# Patient Record
Sex: Female | Born: 1970 | Race: Black or African American | Hispanic: No | Marital: Married | Smoking: Current every day smoker
Health system: Southern US, Community
[De-identification: ages and names within clinical notes are randomized; demographics above are authoritative.]

## PROBLEM LIST (undated history)

## (undated) DIAGNOSIS — A6009 Herpesviral infection of other urogenital tract: Secondary | ICD-10-CM

## (undated) HISTORY — PX: TUBAL LIGATION: SHX77

---

## 2002-11-17 ENCOUNTER — Emergency Department (HOSPITAL_COMMUNITY): Admission: EM | Admit: 2002-11-17 | Discharge: 2002-11-17 | Payer: Self-pay

## 2003-03-04 ENCOUNTER — Emergency Department (HOSPITAL_COMMUNITY): Admission: EM | Admit: 2003-03-04 | Discharge: 2003-03-04 | Payer: Self-pay | Admitting: Emergency Medicine

## 2004-05-15 ENCOUNTER — Emergency Department (HOSPITAL_COMMUNITY): Admission: EM | Admit: 2004-05-15 | Discharge: 2004-05-16 | Payer: Self-pay | Admitting: Emergency Medicine

## 2004-07-10 ENCOUNTER — Other Ambulatory Visit: Admission: RE | Admit: 2004-07-10 | Discharge: 2004-07-10 | Payer: Self-pay | Admitting: Family Medicine

## 2006-02-09 IMAGING — US US TRANSVAGINAL NON-OB
1 series · 14 of 25 positions shown · non-contrast
Comparison: report from prior exam from 11/17/02.

CLINICAL DATA: Abdominal pain, nausea, vomiting
 TRANSABDOMINAL AND TRANSVAGINAL PELVIC ULTRASOUND

[Series 1: unknown · 0.30mm/px · 14 of 43 slices shown]
[im 1/43]
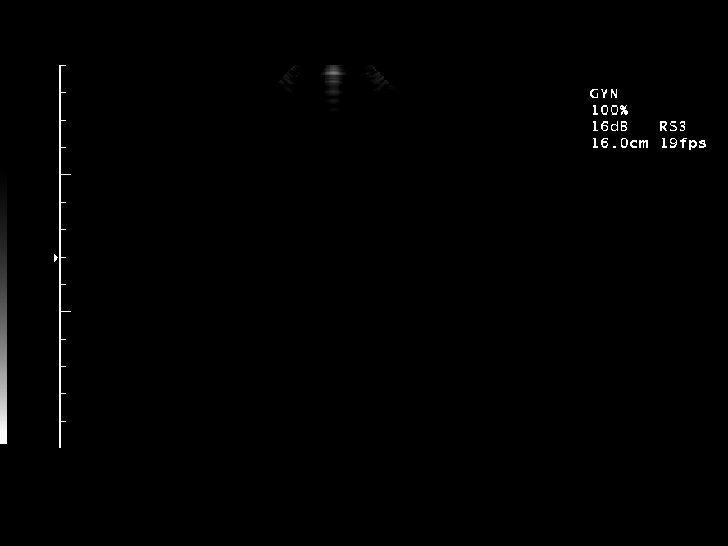
[im 4/43]
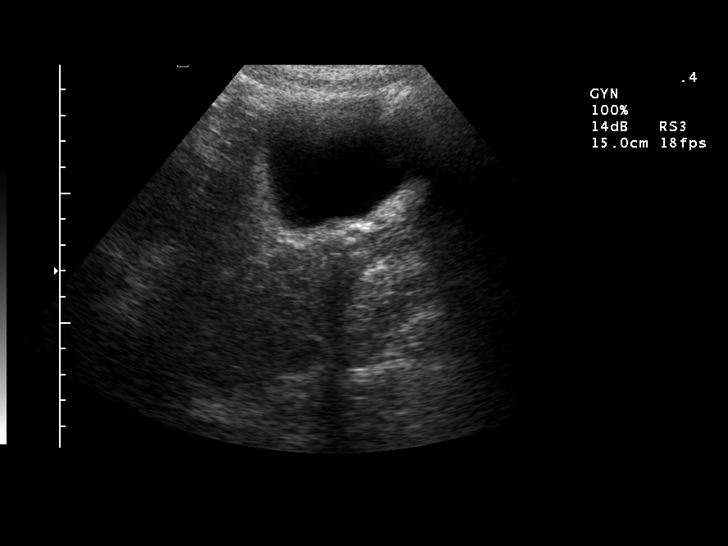
[im 8/43]
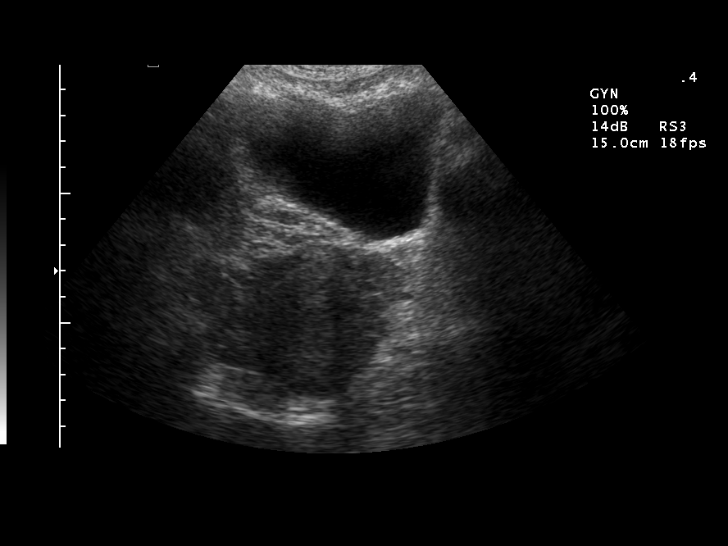
[im 11/43]
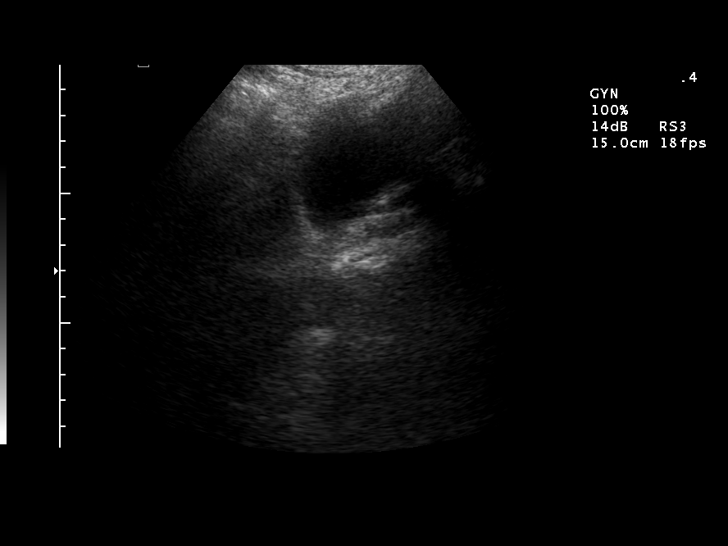
[im 15/43]
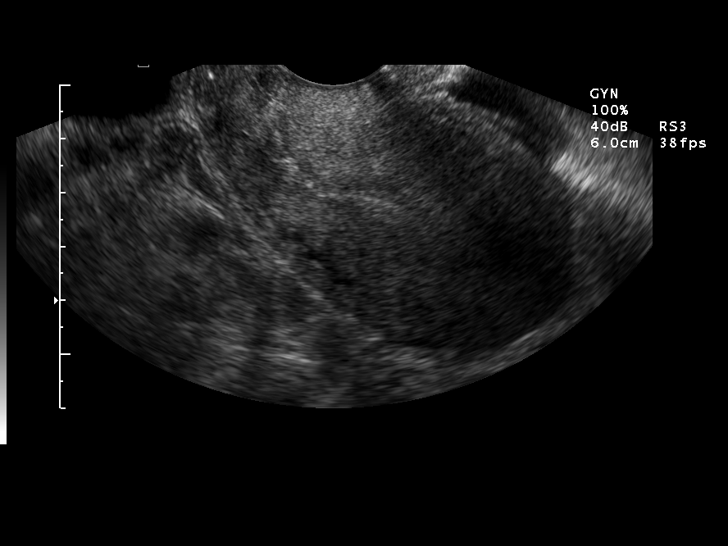
[im 16/43]
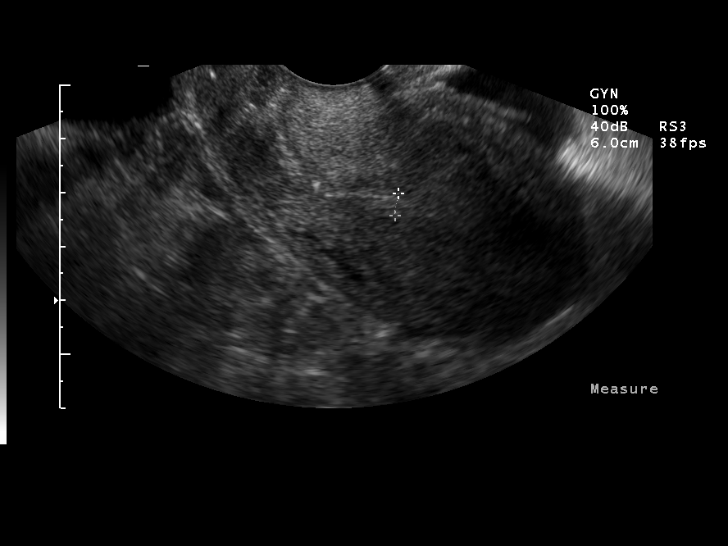
[im 20/43]
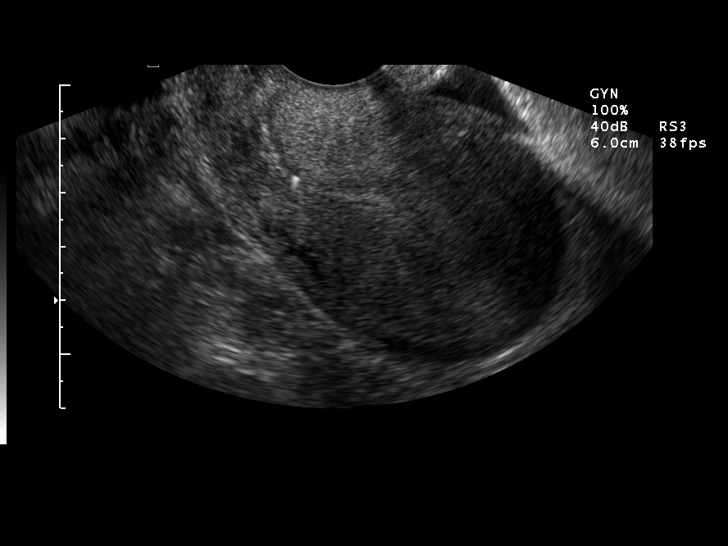
[im 23/43]
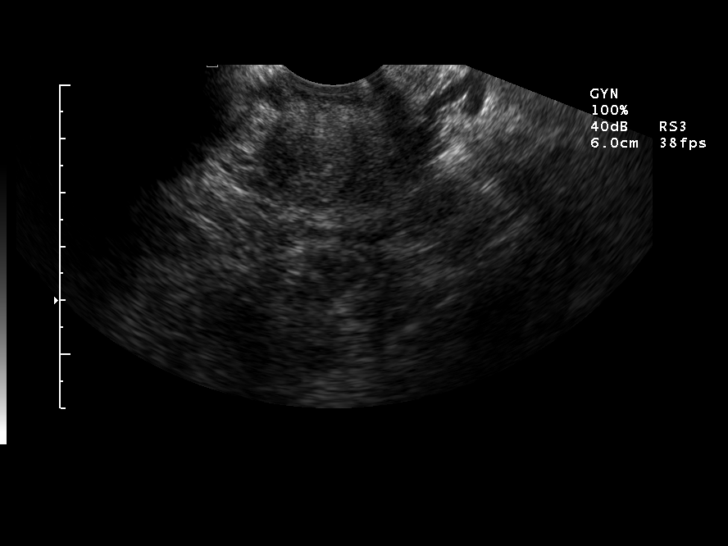
[im 27/43]
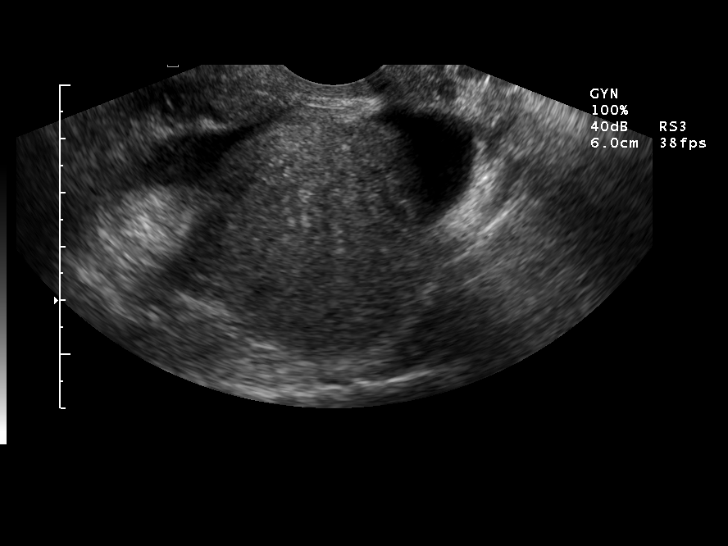
[im 29/43]
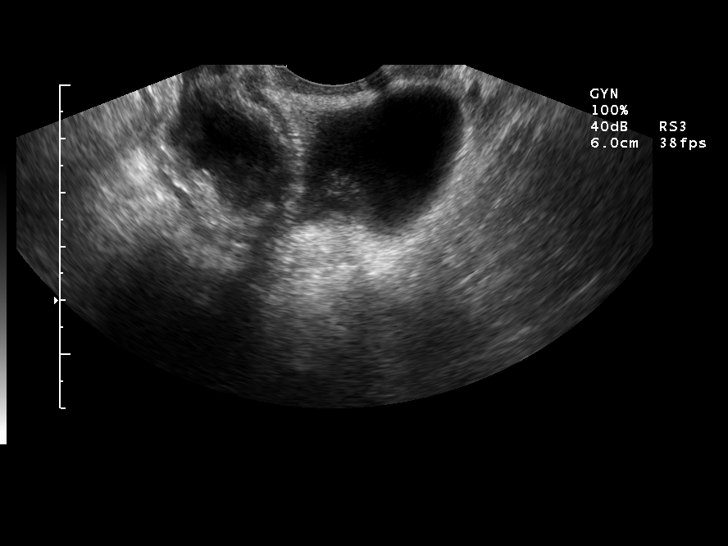
[im 32/43]
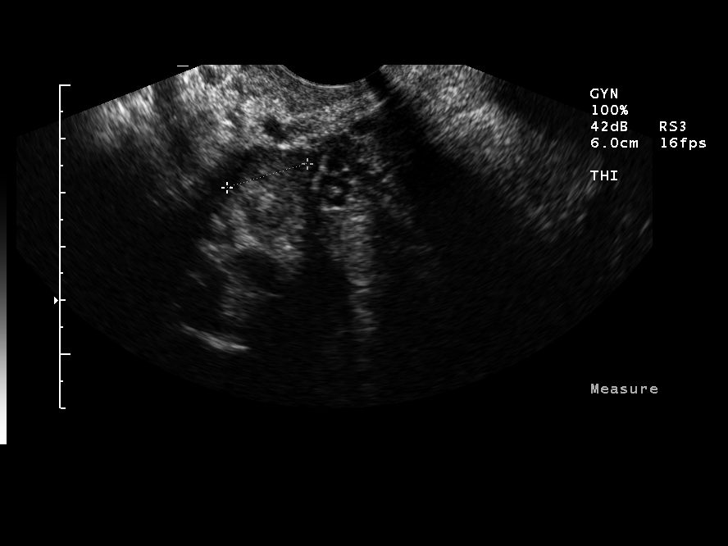
[im 36/43]
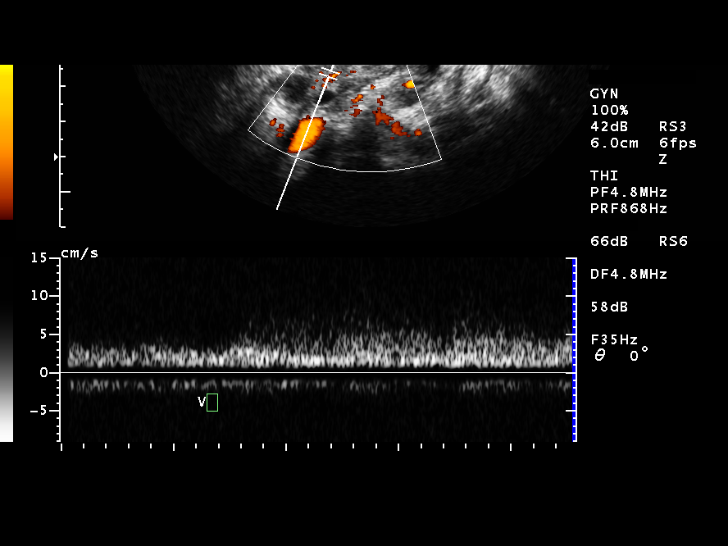
[im 39/43]
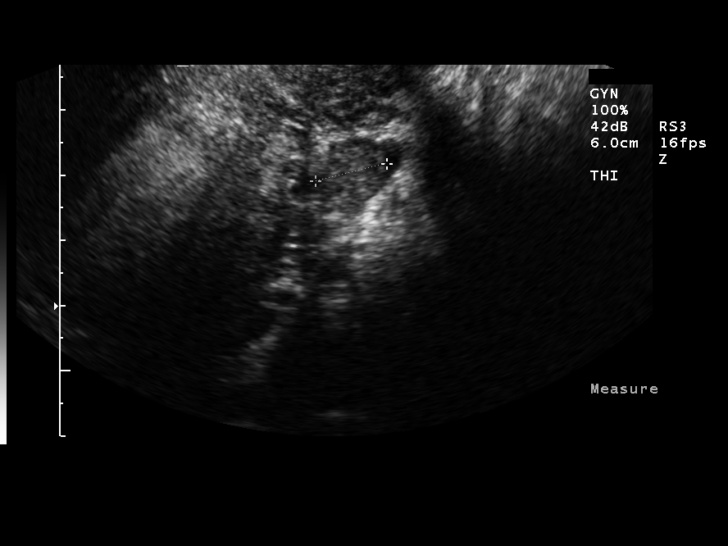
[im 43/43]
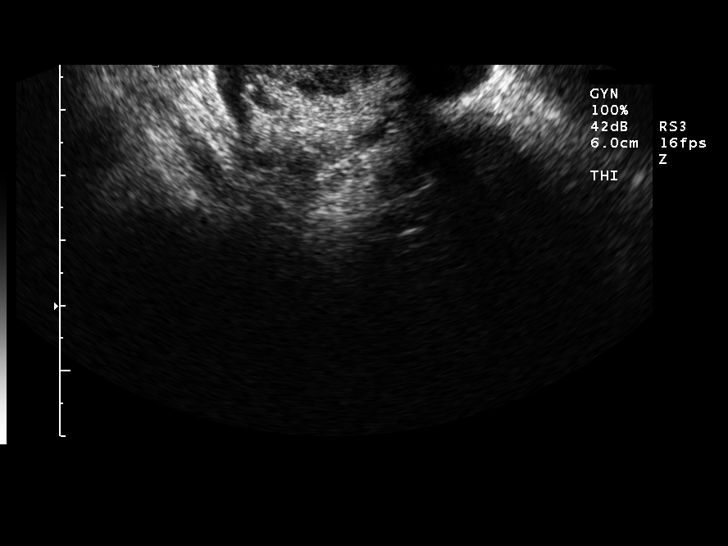

[14 of 25 positions shown; findings below may reference images not displayed]

FINDINGS: There is noted to be some cervical tenderness on the transvaginal portion of the exam.
 The uterus is retroverted.  Endometrial stripe measures 4 mm in thickness. There is moderate free pelvic fluid.  Myometrium appears unremarkable.
 Right ovary measures 2.9 x 1.6 x 1.6 cm and appears within normal limits.  Arterial and venous flow is demonstrated in the right ovary.
 Left ovary measures 2.6 x 1.5 x 1.4 cm and likewise appears normal with arterial and venous flow demonstrated on doppler evaluation.
 IMPRESSION
 Moderate free pelvic fluid. The cause of this free fluid is not readily apparent.
 There was some cervical tenderness.  Correlate with physical exam and assessment for PID.  We do not demonstrate evidence of a tuboovarian abscess.  Also recommend urine pregnancy test to exclude the possibility of occult ectopic pregnancy.

## 2006-04-16 ENCOUNTER — Emergency Department (HOSPITAL_COMMUNITY): Admission: EM | Admit: 2006-04-16 | Discharge: 2006-04-16 | Payer: Self-pay | Admitting: Emergency Medicine

## 2006-04-18 ENCOUNTER — Emergency Department (HOSPITAL_COMMUNITY): Admission: EM | Admit: 2006-04-18 | Discharge: 2006-04-18 | Payer: Self-pay | Admitting: Emergency Medicine

## 2006-04-19 ENCOUNTER — Emergency Department (HOSPITAL_COMMUNITY): Admission: AD | Admit: 2006-04-19 | Discharge: 2006-04-19 | Payer: Self-pay | Admitting: Emergency Medicine

## 2013-05-24 ENCOUNTER — Encounter (HOSPITAL_COMMUNITY): Payer: Self-pay | Admitting: *Deleted

## 2013-05-24 ENCOUNTER — Emergency Department (HOSPITAL_COMMUNITY)
Admission: EM | Admit: 2013-05-24 | Discharge: 2013-05-24 | Disposition: A | Discharge status: Home / Self Care | Payer: No Typology Code available for payment source | Attending: Emergency Medicine | Admitting: Emergency Medicine

## 2013-05-24 DIAGNOSIS — A6009 Herpesviral infection of other urogenital tract: Secondary | ICD-10-CM

## 2013-05-24 DIAGNOSIS — F17200 Nicotine dependence, unspecified, uncomplicated: Secondary | ICD-10-CM | POA: Insufficient documentation

## 2013-05-24 DIAGNOSIS — B009 Herpesviral infection, unspecified: Secondary | ICD-10-CM | POA: Insufficient documentation

## 2013-05-24 HISTORY — DX: Herpesviral infection of other urogenital tract: A60.09

## 2013-05-24 MED ORDER — VALACYCLOVIR HCL 500 MG PO TABS: 1000.0000 mg | ORAL_TABLET | Freq: Two times a day (BID) | ORAL | Status: AC

## 2013-05-24 NOTE — ED Provider Notes (Signed)
Medical screening examination/treatment/procedure(s) were performed by non-physician practitioner and as supervising physician I was immediately available for consultation/collaboration.   Shelda Jakes, MD 05/24/13 1120

## 2013-05-24 NOTE — ED Provider Notes (Signed)
CSN: [id number]     Arrival date & time 05/24/13  0932 History   First MD Initiated Contact with Patient 05/24/13 1021     Chief Complaint  Patient presents with  . Medication Refill   (Consider location/radiation/quality/duration/timing/severity/associated sxs/prior Treatment) HPI Comments: Patient states she has previously been a patient of Dr. bland in [facility]. She has not been to the doctor in some time. She has a history of genital herpes. She is now out of medication and requesting a refill on this particular medication. The patient is moving from the [place] area to the [facility] area and has not secured a primary physician yet. The patient denies any high fever. She states that she has a rash in the genital area that is typical of her usual outbreak. He's not had nausea vomiting or diarrhea. There've been no other problems or symptoms reported.  The history is provided by the patient.    Past Medical History  Diagnosis Date  . Herpes genitalis in women    Past Surgical History  Procedure Laterality Date  . Tubal ligation     No family history on file. History  Substance Use Topics  . Smoking status: Current Every Day Smoker    Types: Cigarettes  . Smokeless tobacco: Not on file  . Alcohol Use: Yes     Comment: daily beer   OB History   Grav Para Term Preterm Abortions TAB SAB Ect Mult Living                 Review of Systems  Constitutional: Negative for activity change.       All ROS Neg except as noted in HPI  HENT: Negative for nosebleeds and neck pain.   Eyes: Negative for photophobia and discharge.  Respiratory: Negative for cough, shortness of breath and wheezing.   Cardiovascular: Negative for chest pain and palpitations.  Gastrointestinal: Negative for abdominal pain and blood in stool.  Genitourinary: Positive for vaginal pain. Negative for dysuria, frequency and hematuria.  Musculoskeletal: Negative for back pain and  arthralgias.  Skin: Positive for rash.  Neurological: Negative for dizziness, seizures and speech difficulty.  Psychiatric/Behavioral: Negative for hallucinations and confusion.    Allergies  Review of patient's allergies indicates no known allergies.  Home Medications  No current outpatient prescriptions on file. BP 109/87  Pulse 79  Temp(Src) 99 F (37.2 C) (Oral)  Resp 16  Ht 5\' 6"  (1.676 m)  Wt 98 lb (44.453 kg)  BMI 15.83 kg/m2  SpO2 98%  LMP 05/24/2013 Physical Exam  Nursing note and vitals reviewed. Constitutional: She is oriented to person, place, and time. She appears well-developed and well-nourished.  Non-toxic appearance.  HENT:  Head: Normocephalic.  Right Ear: Tympanic membrane and external ear normal.  Left Ear: Tympanic membrane and external ear normal.  Eyes: EOM and lids are normal. Pupils are equal, round, and reactive to light.  Neck: Normal range of motion. Neck supple. Carotid bruit is not present.  Cardiovascular: Normal rate, regular rhythm, normal heart sounds, intact distal pulses and normal pulses.   Pulmonary/Chest: Breath sounds normal. No respiratory distress.  Abdominal: Soft. Bowel sounds are normal. There is no tenderness. There is no guarding.  Musculoskeletal: Normal range of motion.  Lymphadenopathy:       Head (right side): No submandibular adenopathy present.       Head (left side): No submandibular adenopathy present.    She has no cervical adenopathy.  Neurological: She is alert and  oriented to person, place, and time. She has normal strength. No cranial nerve deficit or sensory deficit.  Skin: Skin is warm and dry.  Psychiatric: She has a normal mood and affect. Her speech is normal.    ED Course  Procedures (including critical care time) Labs Review Labs Reviewed - No data to display Imaging Review No results found.  MDM  No diagnosis found. *I have reviewed nursing notes, vital signs, and all appropriate lab and imaging  results for this patient.**  Patient has history of genital herpes. She is now experiencing an outbreak over last 2 days. The patient is usually treated with Valtrex. She is moving from [facility] to the [facility] area. She request assistance with her Valtrex until she can establish a primary physician.  Valtrex 500 mg twice a day for 3 days given to the patient.  Kathie Dike, PA-C 05/24/13 1110

## 2013-05-24 NOTE — ED Notes (Signed)
Pt states she has herpes and does not have a PMD. States she has developed an outbreak x 2 days and needs a Rx of valtrex.

## 2013-05-24 NOTE — Progress Notes (Signed)
ED/CM noted patient did not have health insurance and/or PCP listed in the computer.  Patient was given the [place] resource handout with information on the clinics, food pantries, and the handout for new health insurance sign-up.  Patient expressed appreciation for this. 

## 2014-08-12 ENCOUNTER — Ambulatory Visit (INDEPENDENT_AMBULATORY_CARE_PROVIDER_SITE_OTHER): Payer: 59 | Admitting: Obstetrics and Gynecology

## 2014-08-12 ENCOUNTER — Encounter: Payer: Self-pay | Admitting: Obstetrics and Gynecology

## 2014-08-12 VITALS — BP 110/74 | Ht 66.0 in | Wt 113.0 lb

## 2014-08-12 DIAGNOSIS — A6009 Herpesviral infection of other urogenital tract: Secondary | ICD-10-CM

## 2014-08-12 DIAGNOSIS — A609 Anogenital herpesviral infection, unspecified: Secondary | ICD-10-CM

## 2014-08-12 MED ORDER — ACYCLOVIR 400 MG PO TABS: 400.0000 mg | ORAL_TABLET | Freq: Two times a day (BID) | ORAL | Status: DC

## 2014-08-12 NOTE — Progress Notes (Signed)
Patient ID: Barbara Walker, female   DOB: 07/27/1971, 43 y.o.   MRN: [id number] Pt here today for refill on medication. Pt needs a refill on her valtrex. Pt states that she is having tingling down there, the felling she gets before she has an outbreak.

## 2014-08-12 NOTE — Progress Notes (Signed)
   [facility] Visit  Patient name: Barbara Walker Santino MRN [id number]  Date of birth: 01/30/1971  CC & HPI:  Barbara Walker [place] is a 43 y.o. female presenting today for refill on her medication. The medication that she needs a refill on is Valtrex. She knows that she needs a refill because there is tingling in her vaginal region and this is the feeling that she has when she is about to have a outbreak. She treats herself as a suppression. She reports that she was taking acylcovir before she lost her insurance. She has just gotten her insurance back.   ROS:  No other complaints single no sexual partner.  Pertinent History Reviewed:   Reviewed: Significant for  Medical         Past Medical History  Diagnosis Date  . Herpes genitalis in women                               Surgical Hx:    Past Surgical History  Procedure Laterality Date  . Tubal ligation     Medications: Reviewed & Updated - see associated section                      No current outpatient prescriptions on file.   Social History: Reviewed -  reports that she has been smoking Cigarettes.  She has a 20 pack-year smoking history. She has never used smokeless tobacco.  Objective Findings:  Vitals: Blood pressure 110/74, height 5\' 6"  (1.676 m), weight 113 lb (51.256 kg).  Physical Examination: General appearance - alert, well appearing, and in no distress and oriented to person, place, and time Mental status - alert, oriented to person, place, and time, normal mood, behavior, speech, dress, motor activity, and thought processes Pelvic deferred.  Assessment & Plan:   A:  1. 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 Acyclovir Rx q 90 days with three refills.     This chart was scribed for Tilda BurrowJohn Saladin Petrelli V, MD by Chestine SporeSoijett Blue, ED Scribe. The patient was seen in room 2 at 12:45 PM.

## 2015-10-13 ENCOUNTER — Other Ambulatory Visit: Payer: Self-pay | Admitting: Obstetrics and Gynecology

## 2015-10-26 ENCOUNTER — Other Ambulatory Visit: Payer: Self-pay | Admitting: Obstetrics and Gynecology

## 2015-10-26 NOTE — Telephone Encounter (Signed)
refil acylovir

## 2017-01-16 ENCOUNTER — Other Ambulatory Visit: Payer: Self-pay | Admitting: Obstetrics and Gynecology

## 2017-09-17 ENCOUNTER — Other Ambulatory Visit: Payer: Self-pay | Admitting: Obstetrics and Gynecology

## 2017-09-22 ENCOUNTER — Other Ambulatory Visit: Payer: Self-pay | Admitting: Obstetrics and Gynecology

## 2017-10-02 ENCOUNTER — Encounter: Payer: Self-pay | Admitting: Obstetrics and Gynecology

## 2017-10-02 ENCOUNTER — Ambulatory Visit (INDEPENDENT_AMBULATORY_CARE_PROVIDER_SITE_OTHER): Payer: 59 | Admitting: Obstetrics and Gynecology

## 2017-10-02 VITALS — BP 138/86 | HR 117 | Ht 66.0 in | Wt 110.4 lb

## 2017-10-02 DIAGNOSIS — Z8619 Personal history of other infectious and parasitic diseases: Secondary | ICD-10-CM | POA: Diagnosis not present

## 2017-10-02 DIAGNOSIS — R61 Generalized hyperhidrosis: Secondary | ICD-10-CM | POA: Diagnosis not present

## 2017-10-02 NOTE — Progress Notes (Signed)
   [facility] Visit  10/02/2017            Patient name: Barbara Walker MRN [id number]  Date of birth: 12/21/1970  CC & HPI:  Barbara Beckerntoneia L Stecher is a 47 y.o. female presenting today for medication refill of acyclovir. Her periods stopped at 47 years old. She had a tubal ligation. She has been experiencing night sweats and hot sweats that keep her up at night. She has recently gotten insurance, so did not get her pap smears. She takes acyclovir twice a day for HSV, and denies any breakthroughs. Patient has not been sexually active for a year. She has not been seen in about 3 years. She is interested in getting a full physical exam. Her job requires her to be very active, with moving and lifting heavy weighted items.  ROS:  ROS  (-) fever (+) hot sweats   Pertinent History Reviewed:   Reviewed: Significant for HSV, tubal ligation Medical         Past Medical History:  Diagnosis Date  . Herpes genitalis in women                               Surgical Hx:    Past Surgical History:  Procedure Laterality Date  . TUBAL LIGATION     Medications: Reviewed & Updated - see associated section                       Current Outpatient Medications:  .  acyclovir (ZOVIRAX) 400 MG tablet, TAKE 1 TABLET BY MOUTH TWICE DAILY FOR  SUPRESSION, Disp: 60 tablet, Rfl: 3   Social History: Reviewed -  reports that she has been smoking cigarettes.  She has a 20.00 pack-year smoking history. she has never used smokeless tobacco.  Objective Findings:  Vitals: Blood pressure 138/86, pulse (!) 117, height 5\' 6"  (1.676 m), weight 110 lb 6.4 oz (50.1 kg), last menstrual period 05/24/2013.  PHYSICAL EXAMINATION General appearance - alert, well appearing, and in no distress, oriented to person, place, and time and normal appearing weight Mental status - alert, oriented to person, place, and time, normal mood, behavior, speech, dress, motor activity, and thought processes Chest - not examined Heart -  normal rate, regular rhythm, normal S1, S2, no murmurs, rubs, clicks or gallops, not examined Abdomen - not examined Breasts - breasts appear normal, no suspicious masses, no skin or nipple changes or axillary nodes, not examined Skin -   PELVIC Not indicated   Assessment & Plan:   A:  1.  hx of HSV2, on suppression, renew pill acyclovir 400 bid supression 2.    Needs well woman visit, no paps in 10 years  P:  1. Acyclovir refilled 2. F/u in 1 month for well woman exam   By signing my name below, I, Izna Ahmed, attest that this documentation has been prepared under the direction and in the presence of Tilda BurrowFerguson, Shamicka Inga V, MD. Electronically Signed: Redge GainerIzna Ahmed, Medical Scribe. 10/02/17. 11:31 AM.  I personally performed the services described in this documentation, which was SCRIBED in my presence. The recorded information has been reviewed and considered accurate. It has been edited as necessary during review. Tilda BurrowJohn V Darelle Kings, MD

## 2017-10-30 ENCOUNTER — Other Ambulatory Visit: Payer: 59 | Admitting: Obstetrics and Gynecology

## 2018-05-28 ENCOUNTER — Other Ambulatory Visit: Payer: Self-pay | Admitting: Obstetrics and Gynecology

## 2021-04-25 ENCOUNTER — Emergency Department (HOSPITAL_BASED_OUTPATIENT_CLINIC_OR_DEPARTMENT_OTHER)
Admission: EM | Admit: 2021-04-25 | Discharge: 2021-04-25 | Disposition: A | Discharge status: Home / Self Care | Payer: 59 | Source: Home / Self Care | Attending: Emergency Medicine | Admitting: Emergency Medicine

## 2021-04-25 ENCOUNTER — Emergency Department (HOSPITAL_BASED_OUTPATIENT_CLINIC_OR_DEPARTMENT_OTHER)
Admission: EM | Admit: 2021-04-25 | Discharge: 2021-04-25 | Disposition: A | Discharge status: Home / Self Care | Payer: 59 | Attending: Emergency Medicine | Admitting: Emergency Medicine

## 2021-04-25 ENCOUNTER — Other Ambulatory Visit: Payer: Self-pay

## 2021-04-25 ENCOUNTER — Emergency Department (HOSPITAL_BASED_OUTPATIENT_CLINIC_OR_DEPARTMENT_OTHER): Payer: 59

## 2021-04-25 ENCOUNTER — Encounter (HOSPITAL_BASED_OUTPATIENT_CLINIC_OR_DEPARTMENT_OTHER): Payer: Self-pay | Admitting: *Deleted

## 2021-04-25 DIAGNOSIS — R1013 Epigastric pain: Secondary | ICD-10-CM | POA: Insufficient documentation

## 2021-04-25 DIAGNOSIS — R1084 Generalized abdominal pain: Secondary | ICD-10-CM | POA: Insufficient documentation

## 2021-04-25 DIAGNOSIS — R197 Diarrhea, unspecified: Secondary | ICD-10-CM | POA: Insufficient documentation

## 2021-04-25 DIAGNOSIS — R112 Nausea with vomiting, unspecified: Secondary | ICD-10-CM | POA: Insufficient documentation

## 2021-04-25 DIAGNOSIS — F17210 Nicotine dependence, cigarettes, uncomplicated: Secondary | ICD-10-CM | POA: Diagnosis not present

## 2021-04-25 DIAGNOSIS — Z20822 Contact with and (suspected) exposure to covid-19: Secondary | ICD-10-CM | POA: Insufficient documentation

## 2021-04-25 DIAGNOSIS — R509 Fever, unspecified: Secondary | ICD-10-CM

## 2021-04-25 LAB — COMPREHENSIVE METABOLIC PANEL
ALT: 21 U/L (ref 0–44)
AST: 33 U/L (ref 15–41)
Albumin: 4.6 g/dL (ref 3.5–5.0)
Alkaline Phosphatase: 92 U/L (ref 38–126)
Anion gap: 15 (ref 5–15)
BUN: 16 mg/dL (ref 6–20)
CO2: 20 mmol/L — ABNORMAL LOW (ref 22–32)
Calcium: 9.8 mg/dL (ref 8.9–10.3)
Chloride: 104 mmol/L (ref 98–111)
Creatinine, Ser: 0.9 mg/dL (ref 0.44–1.00)
GFR, Estimated: >60 mL/min (ref >60)
Glucose, Bld: 119 mg/dL — ABNORMAL HIGH (ref 70–99)
Potassium: 3.2 mmol/L — ABNORMAL LOW (ref 3.5–5.1)
Sodium: 139 mmol/L (ref 135–145)
Total Bilirubin: 0.9 mg/dL (ref 0.3–1.2)
Total Protein: 7.9 g/dL (ref 6.5–8.1)

## 2021-04-25 LAB — CBC WITH DIFFERENTIAL/PLATELET
Abs Immature Granulocytes: 0.03 10*3/uL (ref 0.00–0.07)
Basophils Absolute: 0.1 10*3/uL (ref 0.0–0.1)
Basophils Relative: 1 %
Eosinophils Absolute: 0.1 10*3/uL (ref 0.0–0.5)
Eosinophils Relative: 1 %
HCT: 46.2 % — ABNORMAL HIGH (ref 36.0–46.0)
Hemoglobin: 16.9 g/dL — ABNORMAL HIGH (ref 12.0–15.0)
Immature Granulocytes: 0 %
Lymphocytes Relative: 15 %
Lymphs Abs: 1.4 10*3/uL (ref 0.7–4.0)
MCH: 33.3 pg (ref 26.0–34.0)
MCHC: 36.6 g/dL — ABNORMAL HIGH (ref 30.0–36.0)
MCV: 91.1 fL (ref 80.0–100.0)
Monocytes Absolute: 0.5 10*3/uL (ref 0.1–1.0)
Monocytes Relative: 6 %
Neutro Abs: 7.2 10*3/uL (ref 1.7–7.7)
Neutrophils Relative %: 77 %
Platelets: 209 10*3/uL (ref 150–400)
RBC: 5.07 MIL/uL (ref 3.87–5.11)
RDW: 13.8 % (ref 11.5–15.5)
WBC: 9.3 10*3/uL (ref 4.0–10.5)
nRBC: 0 % (ref 0.0–0.2)

## 2021-04-25 LAB — URINALYSIS, ROUTINE W REFLEX MICROSCOPIC
Bilirubin Urine: NEGATIVE
Glucose, UA: NEGATIVE mg/dL
Ketones, ur: 40 mg/dL — AB
Nitrite: NEGATIVE
Protein, ur: NEGATIVE mg/dL
Specific Gravity, Urine: 1.01 (ref 1.005–1.030)
pH: 7 (ref 5.0–8.0)

## 2021-04-25 LAB — URINALYSIS, MICROSCOPIC (REFLEX)

## 2021-04-25 LAB — OCCULT BLOOD X 1 CARD TO LAB, STOOL: Fecal Occult Bld: POSITIVE — AB

## 2021-04-25 LAB — LIPASE, BLOOD: Lipase: 40 U/L (ref 11–51)

## 2021-04-25 MED ORDER — DROPERIDOL 2.5 MG/ML IJ SOLN
1.2500 mg | Freq: Once | INTRAMUSCULAR | Status: AC
  Administered 2021-04-25: 1.25 mg via INTRAVENOUS
  Filled 2021-04-25: qty 2

## 2021-04-25 MED ORDER — PROMETHAZINE HCL 25 MG RE SUPP: 25.0000 mg | Freq: Four times a day (QID) | RECTAL | 0 refills | Status: AC | PRN

## 2021-04-25 MED ORDER — MORPHINE SULFATE (PF) 4 MG/ML IV SOLN
4.0000 mg | Freq: Once | INTRAVENOUS | Status: AC
  Administered 2021-04-25: 4 mg via INTRAVENOUS
  Filled 2021-04-25: qty 1

## 2021-04-25 MED ORDER — PANTOPRAZOLE SODIUM 40 MG IV SOLR
40.0000 mg | Freq: Once | INTRAVENOUS | Status: AC
  Administered 2021-04-25: 40 mg via INTRAVENOUS
  Filled 2021-04-25: qty 40

## 2021-04-25 MED ORDER — ONDANSETRON 4 MG PO TBDP: 4.0000 mg | ORAL_TABLET | Freq: Three times a day (TID) | ORAL | 0 refills | Status: DC | PRN

## 2021-04-25 MED ORDER — PANTOPRAZOLE SODIUM 40 MG PO TBEC: 40.0000 mg | DELAYED_RELEASE_TABLET | Freq: Every day | ORAL | 0 refills | Status: DC

## 2021-04-25 MED ORDER — SUCRALFATE 1 GM/10ML PO SUSP: 1.0000 g | Freq: Three times a day (TID) | ORAL | 0 refills | Status: DC

## 2021-04-25 MED ORDER — IOHEXOL 300 MG/ML  SOLN
75.0000 mL | Freq: Once | INTRAMUSCULAR | Status: AC | PRN
  Administered 2021-04-25: 75 mL via INTRAVENOUS

## 2021-04-25 MED ORDER — ACETAMINOPHEN 325 MG PO TABS
650.0000 mg | ORAL_TABLET | Freq: Once | ORAL | Status: AC
  Administered 2021-04-25: 650 mg via ORAL
  Filled 2021-04-25: qty 2

## 2021-04-25 MED ORDER — FAMOTIDINE IN NACL 20-0.9 MG/50ML-% IV SOLN: 20.0000 mg | Freq: Once | INTRAVENOUS | Status: DC

## 2021-04-25 MED ORDER — DICYCLOMINE HCL 20 MG PO TABS: 20.0000 mg | ORAL_TABLET | Freq: Two times a day (BID) | ORAL | 0 refills | Status: AC

## 2021-04-25 MED ORDER — POTASSIUM CHLORIDE CRYS ER 20 MEQ PO TBCR
40.0000 meq | EXTENDED_RELEASE_TABLET | Freq: Once | ORAL | Status: AC
  Administered 2021-04-25: 40 meq via ORAL
  Filled 2021-04-25: qty 2

## 2021-04-25 MED ORDER — SODIUM CHLORIDE 0.9 % IV BOLUS
1000.0000 mL | Freq: Once | INTRAVENOUS | Status: AC
  Administered 2021-04-25: 1000 mL via INTRAVENOUS

## 2021-04-25 MED ORDER — PANTOPRAZOLE SODIUM 40 MG PO TBEC: 40.0000 mg | DELAYED_RELEASE_TABLET | Freq: Every day | ORAL | 0 refills | Status: AC

## 2021-04-25 MED ORDER — SUCRALFATE 1 GM/10ML PO SUSP: 1.0000 g | Freq: Three times a day (TID) | ORAL | 0 refills | Status: AC

## 2021-04-25 MED ORDER — HYDROMORPHONE HCL 1 MG/ML IJ SOLN
0.5000 mg | Freq: Once | INTRAMUSCULAR | Status: AC
  Administered 2021-04-25: 0.5 mg via INTRAVENOUS
  Filled 2021-04-25: qty 1

## 2021-04-25 NOTE — ED Notes (Signed)
ED Provider at bedside. 

## 2021-04-25 NOTE — Discharge Instructions (Addendum)
Please follow-up with gastroenterology.  Please follow up with your primary doctor within the next 5-7 days.  If you do not have a primary care provider, information for a healthcare clinic has been provided for you to make arrangements for follow up care. Please return to the ER sooner if you have any new or worsening symptoms, or if you have any of the following symptoms:  Abdominal pain that does not go away.  You have a fever.  You keep throwing up (vomiting).  The pain is felt only in portions of the abdomen. Pain in the right side could possibly be appendicitis. In an adult, pain in the left lower portion of the abdomen could be colitis or diverticulitis.  You pass bloody or black tarry stools.  There is bright red blood in the stool.  The constipation stays for more than 4 days.  There is belly (abdominal) or rectal pain.  You do not seem to be getting better.  You have any questions or concerns.

## 2021-04-25 NOTE — ED Notes (Signed)
Pt provided with water for PO challenge.

## 2021-04-25 NOTE — ED Triage Notes (Signed)
C/o lower abd pain n/v , seen here earlier  today for same , no relief with meds

## 2021-04-25 NOTE — ED Provider Notes (Addendum)
[facility] EMERGENCY DEPARTMENT Provider Note   CSN: [id number] Arrival date & time: 04/25/21  1035     History Chief Complaint  Patient presents with   Abdominal Pain    Barbara Walker is a 50 y.o. female. She has no notable PMH. She presents to the ED with abdominal pain. Patient states that she has been having nausea and vomiting since last Friday. She was seen by [facility] last Friday where she received a CT scan of her abdomen and was sent home. She stated the pain improved over the weekend, but has worsened again since then. She complains of epigastric abdominal pain that is constant. She describes it as feeling like labor pains. She states that she has noted some bright red blood in her vomit and her stools. She denies any fevers, chest pain, shortness of breath, dizziness, syncope.    Abdominal Pain Associated symptoms: diarrhea, nausea and vomiting   Associated symptoms: no chest pain, no chills, no constipation, no cough, no dysuria, no fever, no hematuria and no shortness of breath       Past Medical History:  Diagnosis Date   Herpes genitalis in women     Patient Active Problem List   Diagnosis Date Noted   Herpes genitalis in women 08/12/2014    Past Surgical History:  Procedure Laterality Date   TUBAL LIGATION       OB History     Gravida  2   Para  2   Term  2   Preterm      AB      Living  2      SAB      IAB      Ectopic      Multiple      Live Births  2           Family History  Problem Relation Age of Onset   Hypertension Mother    Dementia Mother    Parkinson's disease Mother     Social History   Tobacco Use   Smoking status: Every Day    Packs/day: 1.00    Years: 20.00    Pack years: 20.00    Types: Cigarettes   Smokeless tobacco: Never  Substance Use Topics   Alcohol use: Yes    Alcohol/week: 5.0 standard drinks    Types: 3 Cans of beer, 2 Shots of liquor per week    Comment: 2 beers  daily   Drug use: No    Home Medications Prior to Admission medications   Medication Sig Start Date End Date Taking? Authorizing Provider  acyclovir (ZOVIRAX) 400 MG tablet TAKE 1 TABLET BY MOUTH TWICE DAILY FOR SUPPRESSION 05/28/18   Tilda Burrow, MD  ondansetron (ZOFRAN ODT) 4 MG disintegrating tablet Take 1 tablet (4 mg total) by mouth every 8 (eight) hours as needed for up to 20 doses for nausea or vomiting. 04/25/21   Milagros Loll, MD  pantoprazole (PROTONIX) 40 MG tablet Take 1 tablet (40 mg total) by mouth daily. 04/25/21   Milagros Loll, MD  sucralfate (CARAFATE) 1 GM/10ML suspension Take 10 mLs (1 g total) by mouth 4 (four) times daily -  with meals and at bedtime for 14 days. 04/25/21 05/09/21  Milagros Loll, MD    Allergies    Patient has no known allergies.  Review of Systems   Review of Systems  Constitutional:  Negative for chills and fever.  Eyes:  Negative  for visual disturbance.  Respiratory:  Negative for cough, chest tightness and shortness of breath.   Cardiovascular:  Negative for chest pain, palpitations and leg swelling.  Gastrointestinal:  Positive for abdominal pain, blood in stool, diarrhea, nausea and vomiting. Negative for abdominal distention and constipation.  Genitourinary:  Negative for dysuria, hematuria and pelvic pain.  Neurological:  Negative for dizziness, syncope and light-headedness.  All other systems reviewed and are negative.  Physical Exam Updated Vital Signs BP 128/71   Pulse 85   Temp 98.1 F (36.7 C) (Oral)   Resp 17   Ht 5\' 6"  (1.676 m)   Wt 56.7 kg   LMP 05/24/2013   SpO2 98%   BMI 20.18 kg/m   Physical Exam Vitals and nursing note reviewed. Exam conducted with a chaperone present.  Constitutional:      General: She is in acute distress.     Appearance: Normal appearance. She is normal weight. She is not ill-appearing, toxic-appearing or diaphoretic.  HENT:     Head: Normocephalic and atraumatic.      Mouth/Throat:     Mouth: Mucous membranes are moist.     Pharynx: Oropharynx is clear. No oropharyngeal exudate or posterior oropharyngeal erythema.  Eyes:     General: No scleral icterus.       Right eye: No discharge.        Left eye: No discharge.     Conjunctiva/sclera: Conjunctivae normal.     Pupils: Pupils are equal, round, and reactive to light.  Cardiovascular:     Rate and Rhythm: Normal rate and regular rhythm.     Pulses: Normal pulses.     Heart sounds: Normal heart sounds, S1 normal and S2 normal. No murmur heard.   No friction rub. No gallop.  Pulmonary:     Effort: Pulmonary effort is normal. No respiratory distress.     Breath sounds: Normal breath sounds. No wheezing, rhonchi or rales.  Abdominal:     General: Abdomen is flat. Bowel sounds are normal. There is no distension.     Palpations: Abdomen is soft. There is no pulsatile mass.     Tenderness: There is abdominal tenderness in the epigastric area. There is no guarding or rebound. Negative signs include McBurney's sign.  Genitourinary:    Rectum: Normal. Guaiac result positive. No tenderness.  Musculoskeletal:     Right lower leg: No edema.     Left lower leg: No edema.  Skin:    General: Skin is warm and dry.     Coloration: Skin is not jaundiced.     Findings: No bruising, erythema, lesion or rash.  Neurological:     General: No focal deficit present.     Mental Status: She is alert and oriented to person, place, and time.  Psychiatric:        Mood and Affect: Mood is anxious.        Behavior: Behavior normal.    ED Results / Procedures / Treatments   Labs (all labs ordered are listed, but only abnormal results are displayed) Labs Reviewed  CBC WITH DIFFERENTIAL/PLATELET - Abnormal; Notable for the following components:      Result Value   Hemoglobin 16.9 (*)    HCT 46.2 (*)    MCHC 36.6 (*)    All other components within normal limits  COMPREHENSIVE METABOLIC PANEL - Abnormal; Notable for the  following components:   Potassium 3.2 (*)    CO2 20 (*)    Glucose, Bld  119 (*)    All other components within normal limits  OCCULT BLOOD X 1 CARD TO LAB, STOOL - Abnormal; Notable for the following components:   Fecal Occult Bld POSITIVE (*)    All other components within normal limits  LIPASE, BLOOD  POC OCCULT BLOOD, ED    EKG None  Radiology No results found.  Procedures Procedures   Medications Ordered in ED Medications  pantoprazole (PROTONIX) injection 40 mg (40 mg Intravenous Given 04/25/21 1102)  droperidol (INAPSINE) 2.5 MG/ML injection 1.25 mg (1.25 mg Intravenous Given 04/25/21 1101)  HYDROmorphone (DILAUDID) injection 0.5 mg (0.5 mg Intravenous Given 04/25/21 1102)  potassium chloride SA (KLOR-CON) CR tablet 40 mEq (40 mEq Oral Given 04/25/21 1139)    ED Course  I have reviewed the triage vital signs and the nursing notes.  Pertinent labs & imaging results that were available during my care of the patient were reviewed by me and considered in my medical decision making (see chart for details).    MDM Rules/Calculators/A&P                          This is a 50 y.o. female who presents to the ED with epigastric abdominal pain. She initially was in acute distress and shouting for pain medication and dry heaving. She was given 0.5 mg Dilaudid and 1.25 mg of Droperidol. She was given Protonix for possible GI ulcer protection given history of melena and hematemesis.   Labs revealed a stable hgb. Creatinine at baseline. No liver enzyme abnormalities. Potassium 3.2 which was replaced while in the ED. All other electrolytes normal. Lactate normal. She did have a positive hemoccult.   On reexamination, patient is no longer in distress. She states that she is feeling much better. She is able to tolerate fluids without vomiting.   With stable vital signs and normal labs, I do not feel that patient needs to be admitted at this time. She is referred to outpatient GI clinic for  further evaluation of melana. She is prescribed a PPI, antiemetic, and carafate for her symptoms. She was given return precautions if her symptoms worsen.   Final Clinical Impression(s) / ED Diagnoses Final diagnoses:  Epigastric pain    Rx / DC Orders ED Discharge Orders          Ordered    pantoprazole (PROTONIX) 40 MG tablet  Daily,   Status:  Discontinued        04/25/21 1155    ondansetron (ZOFRAN ODT) 4 MG disintegrating tablet  Every 8 hours PRN,   Status:  Discontinued        04/25/21 1155    sucralfate (CARAFATE) 1 GM/10ML suspension  3 times daily with meals & bedtime,   Status:  Discontinued        04/25/21 1155    ondansetron (ZOFRAN ODT) 4 MG disintegrating tablet  Every 8 hours PRN,   Status:  Discontinued        04/25/21 1156    ondansetron (ZOFRAN ODT) 4 MG disintegrating tablet  Every 8 hours PRN        04/25/21 1212    pantoprazole (PROTONIX) 40 MG tablet  Daily        04/25/21 1212    sucralfate (CARAFATE) 1 GM/10ML suspension  3 times daily with meals & bedtime        04/25/21 1212             Quanisha Drewry, Finis Bud,  PA-C 04/25/21 1532    Therese Sarah 04/25/21 1620    Milagros Loll, MD 04/26/21 [phone]

## 2021-04-25 NOTE — ED Notes (Signed)
Pt resting comfortably in bed at this time

## 2021-04-25 NOTE — ED Provider Notes (Signed)
[facility] EMERGENCY DEPARTMENT Provider Note   CSN: [id number] Arrival date & time: 04/25/21  1856     History Chief Complaint  Patient presents with   Abdominal Pain    Barbara Walker is a 50 y.o. female who presents to the ED Today with continued abdominal pain, nausea, and emesis. PT was seen in the ED earlier today for same. She had mentioned during that ED visit that she was seen at [facility] on Friday for same with a negative CT scan. Pt had reassuring labs in the ED earlier today and normal vital signs. Her symptoms were controlled with pain medication and droperidol and she was able to tolerate PO prior to discharge home. Pt went and pick up her prescriptions for zofran, protonix, and carafate. She reports ongoing vomiting since being home despite medications prompting return ED visit today. Pt was unaware she had a fever however arrived to the ED now with oral temp 100.0.   The history is provided by the patient and medical records.      Past Medical History:  Diagnosis Date   Herpes genitalis in women     Patient Active Problem List   Diagnosis Date Noted   Herpes genitalis in women 08/12/2014    Past Surgical History:  Procedure Laterality Date   TUBAL LIGATION       OB History     Gravida  2   Para  2   Term  2   Preterm      AB      Living  2      SAB      IAB      Ectopic      Multiple      Live Births  2           Family History  Problem Relation Age of Onset   Hypertension Mother    Dementia Mother    Parkinson's disease Mother     Social History   Tobacco Use   Smoking status: Every Day    Packs/day: 1.00    Years: 20.00    Pack years: 20.00    Types: Cigarettes   Smokeless tobacco: Never  Substance Use Topics   Alcohol use: Yes    Alcohol/week: 5.0 standard drinks    Types: 3 Cans of beer, 2 Shots of liquor per week    Comment: 2 beers daily   Drug use: No    Home Medications Prior to Admission  medications   Medication Sig Start Date End Date Taking? Authorizing Provider  dicyclomine (BENTYL) 20 MG tablet Take 1 tablet (20 mg total) by mouth 2 (two) times daily. 04/25/21  Yes Hyman Hopes, Leata Dominy, PA-C  promethazine (PHENERGAN) 25 MG suppository Place 1 suppository (25 mg total) rectally every 6 (six) hours as needed for nausea or vomiting. 04/25/21  Yes Nyaire Denbleyker, PA-C  acyclovir (ZOVIRAX) 400 MG tablet TAKE 1 TABLET BY MOUTH TWICE DAILY FOR SUPPRESSION 05/28/18   Tilda Burrow, MD  ondansetron (ZOFRAN ODT) 4 MG disintegrating tablet Take 1 tablet (4 mg total) by mouth every 8 (eight) hours as needed for up to 20 doses for nausea or vomiting. 04/25/21   Milagros Loll, MD  pantoprazole (PROTONIX) 40 MG tablet Take 1 tablet (40 mg total) by mouth daily. 04/25/21   Milagros Loll, MD  sucralfate (CARAFATE) 1 GM/10ML suspension Take 10 mLs (1 g total) by mouth 4 (four) times daily -  with meals and at  bedtime for 14 days. 04/25/21 05/09/21  Milagros Loll, MD    Allergies    Patient has no known allergies.  Review of Systems   Review of Systems  Constitutional:  Positive for fever.  Gastrointestinal:  Positive for abdominal pain, diarrhea, nausea and vomiting.  All other systems reviewed and are negative.  Physical Exam Updated Vital Signs BP 119/78 (BP Location: Right Arm)   Pulse 86   Temp (!) 101.9 F (38.8 C) (Rectal)   Resp 16   LMP 05/24/2013   SpO2 97%   Physical Exam Vitals and nursing note reviewed.  Constitutional:      Appearance: She is not ill-appearing or diaphoretic.     Comments: Actively dry heaving  HENT:     Head: Normocephalic and atraumatic.  Eyes:     Conjunctiva/sclera: Conjunctivae normal.  Cardiovascular:     Rate and Rhythm: Normal rate and regular rhythm.  Pulmonary:     Effort: Pulmonary effort is normal.     Breath sounds: Normal breath sounds. No wheezing, rhonchi or rales.  Abdominal:     General: Abdomen is flat.      Palpations: Abdomen is soft.     Tenderness: There is generalized abdominal tenderness. There is no right CVA tenderness, left CVA tenderness, guarding or rebound.  Musculoskeletal:     Cervical back: Neck supple.  Skin:    General: Skin is warm and dry.  Neurological:     Mental Status: She is alert.    ED Results / Procedures / Treatments   Labs (all labs ordered are listed, but only abnormal results are displayed) Labs Reviewed  URINALYSIS, ROUTINE W REFLEX MICROSCOPIC - Abnormal; Notable for the following components:      Result Value   Hgb urine dipstick SMALL (*)    Ketones, ur 40 (*)    Leukocytes,Ua TRACE (*)    All other components within normal limits  URINALYSIS, MICROSCOPIC (REFLEX) - Abnormal; Notable for the following components:   Bacteria, UA RARE (*)    All other components within normal limits  RESP PANEL BY RT-PCR (FLU A&B, COVID) [id number]    EKG EKG Interpretation  Date/Time:  Wednesday April 25 2021 19:37:08 EDT Ventricular Rate:  88 PR Interval:  143 QRS Duration: 82 QT Interval:  391 QTC Calculation: 474 R Axis:   82 Text Interpretation: Sinus rhythm Right atrial enlargement No previous tracing Confirmed by Gwyneth Sprout ([id number]) on 04/25/2021 7:51:42 PM  Radiology CT Abdomen Pelvis W Contrast  Result Date: 04/25/2021 CLINICAL DATA:  Abdominal pain and fever, initial encounter EXAM: CT ABDOMEN AND PELVIS WITH CONTRAST TECHNIQUE: Multidetector CT imaging of the abdomen and pelvis was performed using the standard protocol following bolus administration of intravenous contrast. CONTRAST:  23mL OMNIPAQUE IOHEXOL 300 MG/ML  SOLN COMPARISON:  05/16/2004 FINDINGS: Lower chest: Mild dependent atelectatic changes are noted. No focal infiltrate or effusion is seen. Hepatobiliary: No focal liver abnormality is seen. No gallstones, gallbladder wall thickening, or biliary dilatation. Pancreas: Unremarkable. No pancreatic ductal dilatation or surrounding  inflammatory changes. Spleen: Normal in size without focal abnormality. Adrenals/Urinary Tract: Adrenal glands are within normal limits. Kidneys demonstrate a normal enhancement pattern bilaterally. No renal calculi or obstructive changes are seen. Delayed images demonstrate normal excretion of contrast material. Bladder is well distended. Stomach/Bowel: Minimal diverticular change of the colon is noted without evidence of diverticulitis. The appendix is within normal limits. Small bowel and stomach are within normal limits. Vascular/Lymphatic: Aortic atherosclerosis. No enlarged abdominal or  pelvic lymph nodes. Reproductive: Uterus and bilateral adnexa are unremarkable. Other: No abdominal wall hernia or abnormality. No abdominopelvic ascites. Musculoskeletal: No acute or significant osseous findings. IMPRESSION: Mild diverticulosis without diverticulitis. No other focal abnormality is noted. Electronically Signed   By: Alcide CleverMark  Lukens M.D.   On: 04/25/2021 21:12    Procedures Procedures   Medications Ordered in ED Medications  droperidol (INAPSINE) 2.5 MG/ML injection 1.25 mg (1.25 mg Intravenous Given 04/25/21 2014)  sodium chloride 0.9 % bolus 1,000 mL ( Intravenous Infusion Verify 04/25/21 2240)  acetaminophen (TYLENOL) tablet 650 mg (650 mg Oral Given 04/25/21 2020)  morphine 4 MG/ML injection 4 mg (4 mg Intravenous Given 04/25/21 2017)  iohexol (OMNIPAQUE) 300 MG/ML solution 75 mL (75 mLs Intravenous Contrast Given 04/25/21 2105)    ED Course  I have reviewed the triage vital signs and the nursing notes.  Pertinent labs & imaging results that were available during my care of the patient were reviewed by me and considered in my medical decision making (see chart for details).    MDM Rules/Calculators/A&P                           [age] y ear old female presenting to the ED for the second time today for continued abdominal pain, nausea, vomiting, and diarrhea. She had labs done earlier today  without acute findings and was discharged home after she tolerated PO. Subsequently mentioned that [facility] Per chart review I am unable to see any ED [facility] On arrival to the ED Today pt's oral temp 100.0 and rectal temp 101.9; she was afebrile earlier and unaware she had a fever. Given fever and abdominal pain will plan to repeat CT scan today as I Am [facility]. She had labs done earlier today - will hold off on additional labs at this time however will swab for COVID and obtain U/A. EKG with qtc 447; will provide half dose of droperidol for symptom control as it seemed to help during earlier ED visit - discussed this with attending physician DR. Plunkett given she had droperidol around 11 AM this morning; feels it is appropriate at this time.   U/A without infection CT: IMPRESSION:  Mild diverticulosis without diverticulitis.     No other focal abnormality is noted.   On reevaluation pt resting comfortably. Has been able to tolerate PO without emesis. She had initially declined COVID testing however do feel this is reasonable to rule out. Pt in agreement to rule out. She is otherwise ready to go home. Do not feel she needs to await for the results. Will discharge pending COVID results. Will discharge with phenergan suppository and bentyl for pain. Pt in agreement with plan and stable for discharge.   This note was prepared using Dragon voice recognition software and may include unintentional dictation errors due to the inherent limitations of voice recognition software.  Barbara Walker was evaluated in Emergency Department on 04/25/2021 for the symptoms described in the history of present illness. She was evaluated in the context of the global COVID-19 pandemic, which necessitated consideration that the patient might be at risk for infection with the SARS-CoV-2 virus that causes COVID-19. Institutional  protocols and algorithms that pertain to the evaluation of patients at risk for COVID-19 are in a state of rapid change based on information released by regulatory  bodies including the CDC and federal and state organizations. These policies and algorithms were followed during the patient's care in the ED.   Final Clinical Impression(s) / ED Diagnoses Final diagnoses:  Generalized abdominal pain  Fever, unspecified fever cause  Person under investigation for COVID-19    Rx / DC Orders ED Discharge Orders          Ordered    promethazine (PHENERGAN) 25 MG suppository  Every 6 hours PRN        04/25/21 2302    dicyclomine (BENTYL) 20 MG tablet  2 times daily        04/25/21 2302             Discharge Instructions      Please pick up medications and take as prescribed. I would not use the Zofran you were prescribed earlier on top of the phenergan suppositories.   We have swabbed you for COVID 19 today. Please await your COVID results - we will call you if you test positive. If positive you should self isolate for 5 days. Cleared: 08/30.   Drink plenty of fluids to stay hydrated  Follow up with your PCP regarding ED visit  Return to the ED for any new/worsening symptoms       Tanda Rockers, PA-C 04/25/21 2306    Gwyneth Sprout, MD 04/25/21 [phone]

## 2021-04-25 NOTE — Discharge Instructions (Addendum)
Please pick up medications and take as prescribed. I would not use the Zofran you were prescribed earlier on top of the phenergan suppositories.   We have swabbed you for COVID 19 today. Please await your COVID results - we will call you if you test positive. If positive you should self isolate for 5 days. Cleared: 08/30.   Drink plenty of fluids to stay hydrated  Follow up with your PCP regarding ED visit  Return to the ED for any new/worsening symptoms

## 2021-04-25 NOTE — ED Notes (Signed)
Family at bedside to take pt home, AVS reviewed with family member due to pt being very sleeping post IV opioid meds. Explained each Rx that has been sent electronically to the pharmacy they requested, also discussed diet due to dx with todays visit, also stressed the importance of making follow up appt with GI MD as per ED MD recommendations. Name of GI MD, address and phone number provided. Opportunity for questions provided prior to pt dc to home

## 2021-04-25 NOTE — ED Notes (Signed)
Safety measures in place, sr x 2 up, bed in lowest position, call bell within reach, pt instructed not to get up off stretcher unless staff in room, pt also instructed re:NPO status

## 2021-04-25 NOTE — ED Triage Notes (Signed)
Arrived by EMS, seen in [facility] ED Friday for same complaint, went to work today and had acute onset of cramping, per EMS VS stable, no rebound tenderness, increase in salvia noted, nausea, no vomiting or diarrhea

## 2021-04-25 NOTE — ED Notes (Signed)
Hemoccult Card to Lab, obtained by PA

## 2021-04-26 LAB — RESP PANEL BY RT-PCR (FLU A&B, COVID) ARPGX2
Influenza A by PCR: NEGATIVE
Influenza B by PCR: NEGATIVE
SARS Coronavirus 2 by RT PCR: NEGATIVE

## 2021-04-28 ENCOUNTER — Emergency Department (HOSPITAL_BASED_OUTPATIENT_CLINIC_OR_DEPARTMENT_OTHER)
Admission: EM | Admit: 2021-04-28 | Discharge: 2021-04-28 | Disposition: A | Discharge status: Home / Self Care | Payer: 59 | Attending: Emergency Medicine | Admitting: Emergency Medicine

## 2021-04-28 ENCOUNTER — Encounter (HOSPITAL_BASED_OUTPATIENT_CLINIC_OR_DEPARTMENT_OTHER): Payer: Self-pay | Admitting: Emergency Medicine

## 2021-04-28 ENCOUNTER — Other Ambulatory Visit: Payer: Self-pay

## 2021-04-28 DIAGNOSIS — N760 Acute vaginitis: Secondary | ICD-10-CM | POA: Insufficient documentation

## 2021-04-28 DIAGNOSIS — R112 Nausea with vomiting, unspecified: Secondary | ICD-10-CM | POA: Diagnosis not present

## 2021-04-28 DIAGNOSIS — F17210 Nicotine dependence, cigarettes, uncomplicated: Secondary | ICD-10-CM | POA: Diagnosis not present

## 2021-04-28 DIAGNOSIS — B9689 Other specified bacterial agents as the cause of diseases classified elsewhere: Secondary | ICD-10-CM | POA: Insufficient documentation

## 2021-04-28 DIAGNOSIS — R197 Diarrhea, unspecified: Secondary | ICD-10-CM | POA: Insufficient documentation

## 2021-04-28 LAB — CBC WITH DIFFERENTIAL/PLATELET
Abs Immature Granulocytes: 0.05 10*3/uL (ref 0.00–0.07)
Basophils Absolute: 0.1 10*3/uL (ref 0.0–0.1)
Basophils Relative: 1 %
Eosinophils Absolute: 0.1 10*3/uL (ref 0.0–0.5)
Eosinophils Relative: 1 %
HCT: 44.7 % (ref 36.0–46.0)
Hemoglobin: 16.3 g/dL — ABNORMAL HIGH (ref 12.0–15.0)
Immature Granulocytes: 1 %
Lymphocytes Relative: 18 %
Lymphs Abs: 1.7 10*3/uL (ref 0.7–4.0)
MCH: 33.1 pg (ref 26.0–34.0)
MCHC: 36.5 g/dL — ABNORMAL HIGH (ref 30.0–36.0)
MCV: 90.9 fL (ref 80.0–100.0)
Monocytes Absolute: 0.4 10*3/uL (ref 0.1–1.0)
Monocytes Relative: 4 %
Neutro Abs: 7.5 10*3/uL (ref 1.7–7.7)
Neutrophils Relative %: 75 %
Platelets: 300 10*3/uL (ref 150–400)
RBC: 4.92 MIL/uL (ref 3.87–5.11)
RDW: 13.9 % (ref 11.5–15.5)
WBC: 9.8 10*3/uL (ref 4.0–10.5)
nRBC: 0 % (ref 0.0–0.2)

## 2021-04-28 LAB — URINALYSIS, MICROSCOPIC (REFLEX)

## 2021-04-28 LAB — WET PREP, GENITAL
Sperm: NONE SEEN
Trich, Wet Prep: NONE SEEN
Yeast Wet Prep HPF POC: NONE SEEN

## 2021-04-28 LAB — URINALYSIS, ROUTINE W REFLEX MICROSCOPIC
Bilirubin Urine: NEGATIVE
Glucose, UA: NEGATIVE mg/dL
Ketones, ur: NEGATIVE mg/dL
Leukocytes,Ua: NEGATIVE
Nitrite: NEGATIVE
Protein, ur: NEGATIVE mg/dL
Specific Gravity, Urine: 1.015 (ref 1.005–1.030)
pH: 8 (ref 5.0–8.0)

## 2021-04-28 LAB — COMPREHENSIVE METABOLIC PANEL
ALT: 18 U/L (ref 0–44)
AST: 24 U/L (ref 15–41)
Albumin: 4.5 g/dL (ref 3.5–5.0)
Alkaline Phosphatase: 83 U/L (ref 38–126)
Anion gap: 16 — ABNORMAL HIGH (ref 5–15)
BUN: 14 mg/dL (ref 6–20)
CO2: 21 mmol/L — ABNORMAL LOW (ref 22–32)
Calcium: 9.6 mg/dL (ref 8.9–10.3)
Chloride: 101 mmol/L (ref 98–111)
Creatinine, Ser: 0.79 mg/dL (ref 0.44–1.00)
GFR, Estimated: >60 mL/min (ref >60)
Glucose, Bld: 120 mg/dL — ABNORMAL HIGH (ref 70–99)
Potassium: 3.5 mmol/L (ref 3.5–5.1)
Sodium: 138 mmol/L (ref 135–145)
Total Bilirubin: 0.5 mg/dL (ref 0.3–1.2)
Total Protein: 7.8 g/dL (ref 6.5–8.1)

## 2021-04-28 LAB — PREGNANCY, URINE: Preg Test, Ur: NEGATIVE

## 2021-04-28 LAB — LIPASE, BLOOD: Lipase: 43 U/L (ref 11–51)

## 2021-04-28 MED ORDER — HYDROMORPHONE HCL 1 MG/ML IJ SOLN
0.5000 mg | Freq: Once | INTRAMUSCULAR | Status: AC
  Administered 2021-04-28: 0.5 mg via INTRAVENOUS
  Filled 2021-04-28: qty 1

## 2021-04-28 MED ORDER — LACTATED RINGERS IV BOLUS
1000.0000 mL | Freq: Once | INTRAVENOUS | Status: AC
  Administered 2021-04-28: 1000 mL via INTRAVENOUS

## 2021-04-28 MED ORDER — METOCLOPRAMIDE HCL 5 MG/ML IJ SOLN
10.0000 mg | Freq: Once | INTRAMUSCULAR | Status: AC
  Administered 2021-04-28: 10 mg via INTRAVENOUS
  Filled 2021-04-28: qty 2

## 2021-04-28 MED ORDER — METRONIDAZOLE 500 MG PO TABS: 500.0000 mg | ORAL_TABLET | Freq: Two times a day (BID) | ORAL | 0 refills | Status: AC

## 2021-04-28 MED ORDER — METOCLOPRAMIDE HCL 10 MG PO TABS: 10.0000 mg | ORAL_TABLET | Freq: Three times a day (TID) | ORAL | 0 refills | Status: AC | PRN

## 2021-04-28 NOTE — ED Notes (Signed)
ED Provider at bedside for pelvic exam with [facility], NT for chaperone

## 2021-04-28 NOTE — ED Provider Notes (Signed)
[facility] EMERGENCY DEPARTMENT Provider Note   CSN: [id number] Arrival date & time: 04/28/21  0827     History Chief Complaint  Patient presents with   Abdominal Pain    Barbara Walker is a 50 y.o. female.   Abdominal Pain Associated symptoms: diarrhea, nausea, vaginal discharge and vomiting   Associated symptoms: no chest pain, no chills, no constipation, no cough, no dysuria, no fatigue, no fever, no hematuria, no shortness of breath, no sore throat and no vaginal bleeding   Patient presents for abdominal pain, nausea, and vomiting.  Onset of pain and vomiting was this morning.  She describes her abdominal pain as across her lower abdomen.  She denies any recent urinary symptoms.  She states that she no longer gets menstrual periods.  She does describe a recent vaginal discharge as white in color.  She did have similar abdominal pain 3 days ago.  She was seen in the ED twice on that day.  She reports that she had resolution of her symptoms following these ED visits but that symptoms returned at this morning.  Patient denies any recent fevers or chills.  She was noted to have low-grade fever 3 days ago.  Per chart review, patient underwent laboratory work-up and CT scan of abdomen pelvis 3 days ago.  Results did not show etiology.  Last bowel movement was this morning.  It was notable for diarrhea.  Diarrhea and vomit is described as watery and nonbloody.    Past Medical History:  Diagnosis Date   Herpes genitalis in women     Patient Active Problem List   Diagnosis Date Noted   Herpes genitalis in women 08/12/2014    Past Surgical History:  Procedure Laterality Date   TUBAL LIGATION       OB History     Gravida  2   Para  2   Term  2   Preterm      AB      Living  2      SAB      IAB      Ectopic      Multiple      Live Births  2           Family History  Problem Relation Age of Onset   Hypertension Mother    Dementia Mother     Parkinson's disease Mother     Social History   Tobacco Use   Smoking status: Every Day    Packs/day: 1.00    Years: 20.00    Pack years: 20.00    Types: Cigarettes   Smokeless tobacco: Never  Substance Use Topics   Alcohol use: Yes    Alcohol/week: 5.0 standard drinks    Types: 3 Cans of beer, 2 Shots of liquor per week    Comment: 2 beers daily   Drug use: Yes    Types: Marijuana    Home Medications Prior to Admission medications   Medication Sig Start Date End Date Taking? Authorizing Provider  metoCLOPramide (REGLAN) 10 MG tablet Take 1 tablet (10 mg total) by mouth every 8 (eight) hours as needed for up to 6 doses for nausea or vomiting. 04/28/21  Yes Gloris Manchester, MD  metroNIDAZOLE (FLAGYL) 500 MG tablet Take 1 tablet (500 mg total) by mouth 2 (two) times daily for 7 days. 04/28/21 05/05/21 Yes Gloris Manchester, MD  acyclovir (ZOVIRAX) 400 MG tablet TAKE 1 TABLET BY MOUTH TWICE DAILY FOR SUPPRESSION 05/28/18  Tilda Burrow, MD  dicyclomine (BENTYL) 20 MG tablet Take 1 tablet (20 mg total) by mouth 2 (two) times daily. 04/25/21   Hyman Hopes, Margaux, PA-C  ondansetron (ZOFRAN ODT) 4 MG disintegrating tablet Take 1 tablet (4 mg total) by mouth every 8 (eight) hours as needed for up to 20 doses for nausea or vomiting. 04/25/21   Milagros Loll, MD  pantoprazole (PROTONIX) 40 MG tablet Take 1 tablet (40 mg total) by mouth daily. 04/25/21   Milagros Loll, MD  promethazine (PHENERGAN) 25 MG suppository Place 1 suppository (25 mg total) rectally every 6 (six) hours as needed for nausea or vomiting. 04/25/21   Hyman Hopes, Margaux, PA-C  sucralfate (CARAFATE) 1 GM/10ML suspension Take 10 mLs (1 g total) by mouth 4 (four) times daily -  with meals and at bedtime for 14 days. 04/25/21 05/09/21  Milagros Loll, MD    Allergies    Patient has no known allergies.  Review of Systems   Review of Systems  Constitutional:  Negative for chills, fatigue and fever.  HENT:  Negative for ear pain,  facial swelling and sore throat.   Eyes:  Negative for pain and visual disturbance.  Respiratory:  Negative for cough and shortness of breath.   Cardiovascular:  Negative for chest pain and palpitations.  Gastrointestinal:  Positive for abdominal pain, diarrhea, nausea and vomiting. Negative for abdominal distention, blood in stool and constipation.  Genitourinary:  Positive for vaginal discharge. Negative for dysuria, flank pain, hematuria, pelvic pain, urgency and vaginal bleeding.  Musculoskeletal:  Negative for arthralgias, back pain, myalgias and neck pain.  Skin:  Negative for color change and rash.  Neurological:  Negative for dizziness, seizures, syncope, light-headedness and headaches.  All other systems reviewed and are negative.  Physical Exam Updated Vital Signs BP 93/66   Pulse 71   Temp 98.6 F (37 C) (Oral)   Resp (!) 24   Ht 5\' 6"  (1.676 m)   Wt 56.7 kg   LMP 05/24/2013   SpO2 95%   BMI 20.18 kg/m   Physical Exam Vitals and nursing note reviewed.  Constitutional:      General: She is not in acute distress.    Appearance: She is well-developed.  HENT:     Head: Normocephalic and atraumatic.  Eyes:     Conjunctiva/sclera: Conjunctivae normal.  Cardiovascular:     Rate and Rhythm: Normal rate and regular rhythm.     Heart sounds: No murmur heard. Pulmonary:     Effort: Pulmonary effort is normal. No respiratory distress.     Breath sounds: Normal breath sounds. No wheezing or rales.  Chest:     Chest wall: No tenderness.  Abdominal:     Palpations: Abdomen is soft.     Tenderness: There is abdominal tenderness in the epigastric area and suprapubic area. There is no right CVA tenderness, left CVA tenderness, guarding or rebound.  Genitourinary:    Vagina: No signs of injury and foreign body.     Cervix: Discharge and friability present. No cervical motion tenderness.     Uterus: Normal.      Adnexa: Right adnexa normal and left adnexa normal.   Musculoskeletal:     Cervical back: Neck supple.  Skin:    General: Skin is warm and dry.  Neurological:     General: No focal deficit present.     Mental Status: She is alert and oriented to person, place, and time.  Psychiatric:  Mood and Affect: Mood is anxious.        Behavior: Behavior normal.    ED Results / Procedures / Treatments   Labs (all labs ordered are listed, but only abnormal results are displayed) Labs Reviewed  WET PREP, GENITAL - Abnormal; Notable for the following components:      Result Value   Clue Cells Wet Prep HPF POC PRESENT (*)    WBC, Wet Prep HPF POC MANY (*)    All other components within normal limits  COMPREHENSIVE METABOLIC PANEL - Abnormal; Notable for the following components:   CO2 21 (*)    Glucose, Bld 120 (*)    Anion gap 16 (*)    All other components within normal limits  CBC WITH DIFFERENTIAL/PLATELET - Abnormal; Notable for the following components:   Hemoglobin 16.3 (*)    MCHC 36.5 (*)    All other components within normal limits  URINALYSIS, ROUTINE W REFLEX MICROSCOPIC - Abnormal; Notable for the following components:   Hgb urine dipstick SMALL (*)    All other components within normal limits  URINALYSIS, MICROSCOPIC (REFLEX) - Abnormal; Notable for the following components:   Bacteria, UA FEW (*)    All other components within normal limits  LIPASE, BLOOD  PREGNANCY, URINE  GC/CHLAMYDIA PROBE AMP ([place]) [facility]    EKG EKG Interpretation  Date/Time:  Saturday April 28 2021 08:59:25 EDT Ventricular Rate:  80 PR Interval:    QRS Duration: 80 QT Interval:  405 QTC Calculation: 468 R Axis:   82 Text Interpretation: Normal sinus rhythm Confirmed by Gloris Manchesterixon, Nalayah Hitt ([id number]) on 04/28/2021 9:23:58 AM  Radiology No results found.  Procedures Procedures   Medications Ordered in ED Medications  metoCLOPramide (REGLAN) injection 10 mg (10 mg Intravenous Given 04/28/21 0912)  HYDROmorphone (DILAUDID) injection  0.5 mg (0.5 mg Intravenous Given 04/28/21 0911)  lactated ringers bolus 1,000 mL ( Intravenous Stopped 04/28/21 1012)    ED Course  I have reviewed the triage vital signs and the nursing notes.  Pertinent labs & imaging results that were available during my care of the patient were reviewed by me and considered in my medical decision making (see chart for details).    MDM Rules/Calculators/A&P                           Patient presents for abdominal pain, nausea, and vomiting.  She had similar symptoms 3 days ago and was evaluated in the ED twice for this on that day.  She had resolution of symptoms after that, however, symptoms returned this morning.  Following onset of abdominal pain, patient was try to drink water.  She had p.o. intolerance and everything she drank she subsequently threw up.  She denies any blood in her emesis.  She had 1 episode of diarrhea this morning.  She was noted to have a fever 3 days ago.  She denies any subjective fevers at home.  Patient does state that she has had some white vaginal discharge.  Actively vomiting upon arrival.  Bolus of IV fluids, Dilaudid, and Reglan ordered for symptomatic relief.  Laboratory work-up was initiated.  Patient will require pelvic exam.  On reassessment, patient reported resolution of nausea and improved abdominal pain.  Pelvic exam performed with chaperone present.  Notable findings include a white discharge and mild cervical friability.  Swab sent for laboratory analysis.  Wet prep was positive for clue cells.  On further reassessment,  patient reported resolution of symptoms.  Given her resolved symptoms, in addition to her CT scan 3 days ago, I do not feel that any imaging is indicated today.  Flagyl was prescribed for BV.  Patient was cautioned against use of alcohol with this medication.  Additionally, as needed Reglan was prescribed given its efficacy here in the ED today.  Patient was discharged in stable condition.  Final Clinical  Impression(s) / ED Diagnoses Final diagnoses:  Non-intractable vomiting with nausea, unspecified vomiting type  BV (bacterial vaginosis)    Rx / DC Orders ED Discharge Orders          Ordered    metoCLOPramide (REGLAN) 10 MG tablet  Every 8 hours PRN        04/28/21 1330    metroNIDAZOLE (FLAGYL) 500 MG tablet  2 times daily        04/28/21 1330             Gloris Manchester, MD 04/29/21 [phone]

## 2021-04-28 NOTE — ED Notes (Signed)
Pt ambulatory with steady gait to restroom, will provide urine specimen 

## 2021-04-28 NOTE — ED Notes (Signed)
ED Provider at bedside. 

## 2021-04-28 NOTE — ED Triage Notes (Signed)
Pt arrives pov with c/o lower abdominal pain with n/v/d, seen x 3 days pta for same. Pt gagging in triage. Pt denies recent marijuana use

## 2021-04-28 NOTE — ED Notes (Signed)
Pelvic cart at bedside. 

## 2021-04-30 LAB — GC/CHLAMYDIA PROBE AMP (~~LOC~~) NOT AT ARMC
Chlamydia: NEGATIVE
Comment: NEGATIVE
Comment: NORMAL
Neisseria Gonorrhea: NEGATIVE

## 2021-08-11 ENCOUNTER — Emergency Department (HOSPITAL_BASED_OUTPATIENT_CLINIC_OR_DEPARTMENT_OTHER)
Admission: EM | Admit: 2021-08-11 | Discharge: 2021-08-11 | Disposition: A | Discharge status: Home / Self Care | Payer: 59 | Attending: Emergency Medicine | Admitting: Emergency Medicine

## 2021-08-11 ENCOUNTER — Other Ambulatory Visit: Payer: Self-pay

## 2021-08-11 ENCOUNTER — Encounter (HOSPITAL_BASED_OUTPATIENT_CLINIC_OR_DEPARTMENT_OTHER): Payer: Self-pay | Admitting: *Deleted

## 2021-08-11 DIAGNOSIS — R1084 Generalized abdominal pain: Secondary | ICD-10-CM | POA: Diagnosis not present

## 2021-08-11 DIAGNOSIS — R197 Diarrhea, unspecified: Secondary | ICD-10-CM | POA: Insufficient documentation

## 2021-08-11 DIAGNOSIS — E876 Hypokalemia: Secondary | ICD-10-CM | POA: Diagnosis not present

## 2021-08-11 DIAGNOSIS — R509 Fever, unspecified: Secondary | ICD-10-CM | POA: Diagnosis not present

## 2021-08-11 DIAGNOSIS — F17210 Nicotine dependence, cigarettes, uncomplicated: Secondary | ICD-10-CM | POA: Insufficient documentation

## 2021-08-11 DIAGNOSIS — Z79899 Other long term (current) drug therapy: Secondary | ICD-10-CM | POA: Insufficient documentation

## 2021-08-11 DIAGNOSIS — R112 Nausea with vomiting, unspecified: Secondary | ICD-10-CM

## 2021-08-11 LAB — PREGNANCY, URINE: Preg Test, Ur: NEGATIVE

## 2021-08-11 LAB — COMPREHENSIVE METABOLIC PANEL
ALT: 18 U/L (ref 0–44)
AST: 28 U/L (ref 15–41)
Albumin: 5.6 g/dL — ABNORMAL HIGH (ref 3.5–5.0)
Alkaline Phosphatase: 117 U/L (ref 38–126)
Anion gap: 18 — ABNORMAL HIGH (ref 5–15)
BUN: 14 mg/dL (ref 6–20)
CO2: 23 mmol/L (ref 22–32)
Calcium: 9.9 mg/dL (ref 8.9–10.3)
Chloride: 97 mmol/L — ABNORMAL LOW (ref 98–111)
Creatinine, Ser: 0.9 mg/dL (ref 0.44–1.00)
GFR, Estimated: >60 mL/min (ref >60)
Glucose, Bld: 132 mg/dL — ABNORMAL HIGH (ref 70–99)
Potassium: 3.2 mmol/L — ABNORMAL LOW (ref 3.5–5.1)
Sodium: 138 mmol/L (ref 135–145)
Total Bilirubin: 1 mg/dL (ref 0.3–1.2)
Total Protein: 9.3 g/dL — ABNORMAL HIGH (ref 6.5–8.1)

## 2021-08-11 LAB — URINALYSIS, ROUTINE W REFLEX MICROSCOPIC
Bilirubin Urine: NEGATIVE
Glucose, UA: NEGATIVE mg/dL
Ketones, ur: NEGATIVE mg/dL
Leukocytes,Ua: NEGATIVE
Nitrite: NEGATIVE
Protein, ur: 100 mg/dL — AB
Specific Gravity, Urine: 1.025 (ref 1.005–1.030)
pH: 7 (ref 5.0–8.0)

## 2021-08-11 LAB — CBC
HCT: 48.7 % — ABNORMAL HIGH (ref 36.0–46.0)
Hemoglobin: 17.8 g/dL — ABNORMAL HIGH (ref 12.0–15.0)
MCH: 33 pg (ref 26.0–34.0)
MCHC: 36.6 g/dL — ABNORMAL HIGH (ref 30.0–36.0)
MCV: 90.4 fL (ref 80.0–100.0)
Platelets: 289 10*3/uL (ref 150–400)
RBC: 5.39 MIL/uL — ABNORMAL HIGH (ref 3.87–5.11)
RDW: 14 % (ref 11.5–15.5)
WBC: 12.4 10*3/uL — ABNORMAL HIGH (ref 4.0–10.5)
nRBC: 0 % (ref 0.0–0.2)

## 2021-08-11 LAB — URINALYSIS, MICROSCOPIC (REFLEX)

## 2021-08-11 LAB — LIPASE, BLOOD: Lipase: 29 U/L (ref 11–51)

## 2021-08-11 MED ORDER — POTASSIUM CHLORIDE CRYS ER 20 MEQ PO TBCR
40.0000 meq | EXTENDED_RELEASE_TABLET | Freq: Once | ORAL | Status: AC
  Administered 2021-08-11: 40 meq via ORAL
  Filled 2021-08-11: qty 2

## 2021-08-11 MED ORDER — SODIUM CHLORIDE 0.9 % IV BOLUS
1000.0000 mL | Freq: Once | INTRAVENOUS | Status: AC
Start: 2021-08-11 — End: 2021-08-11
  Administered 2021-08-11: 1000 mL via INTRAVENOUS

## 2021-08-11 MED ORDER — ONDANSETRON HCL 4 MG/2ML IJ SOLN
4.0000 mg | Freq: Once | INTRAMUSCULAR | Status: AC
  Administered 2021-08-11: 4 mg via INTRAVENOUS
  Filled 2021-08-11: qty 2

## 2021-08-11 MED ORDER — AMOXICILLIN-POT CLAVULANATE 875-125 MG PO TABS: 1.0000 | ORAL_TABLET | Freq: Two times a day (BID) | ORAL | 0 refills | Status: AC

## 2021-08-11 MED ORDER — ACETAMINOPHEN 325 MG PO TABS
650.0000 mg | ORAL_TABLET | Freq: Once | ORAL | Status: AC
  Administered 2021-08-11: 650 mg via ORAL
  Filled 2021-08-11: qty 2

## 2021-08-11 MED ORDER — MORPHINE SULFATE (PF) 4 MG/ML IV SOLN
4.0000 mg | Freq: Once | INTRAVENOUS | Status: AC
Start: 2021-08-11 — End: 2021-08-11
  Administered 2021-08-11: 4 mg via INTRAVENOUS
  Filled 2021-08-11: qty 1

## 2021-08-11 MED ORDER — ONDANSETRON 4 MG PO TBDP: 4.0000 mg | ORAL_TABLET | Freq: Three times a day (TID) | ORAL | 0 refills | Status: AC | PRN

## 2021-08-11 NOTE — Discharge Instructions (Addendum)
Take Zofran as needed as prescribed for nausea and vomiting. Take Augmentin as prescribed and complete the full course. Return to the emergency room at any time for further work-up and possible CT scan for any worsening or concerning symptoms. Recheck with your doctor on Monday.

## 2021-08-11 NOTE — ED Triage Notes (Signed)
Pt reports n/v/d and abdominal cramps since 0400. She has taken 2 ODT zofran tablets today

## 2021-08-11 NOTE — ED Notes (Addendum)
Unable to get urine sample, pt is aware that urine is needed. Also unable to obtain blood work, pt kept clenching and unclenching her hands kept moving her arm. Stuck x1

## 2021-08-11 NOTE — ED Notes (Signed)
ZOFRAN HELD D/T PT HAS HAD ZOFRAN ODT X 2 PTA

## 2021-08-11 NOTE — ED Provider Notes (Signed)
[facility] EMERGENCY DEPARTMENT Provider Note   CSN: [id number] Arrival date & time: 08/11/21  1329     History Chief Complaint  Patient presents with   Emesis    Barbara Walker is a 50 y.o. female.  50 year old female with no significant past medical history presents with complaint of nausea, vomiting, diarrhea and stomach cramps with fever.  Symptoms started at 5 AM this morning.  Reports at least 5 episodes of bilious, nonbloody emesis today with about 8 episodes of loose, nonbloody stools.  Temp max 102 at home, has not had any antipyretics prior to arrival.  Denies recent travel, sick contacts.  Prior abdominal surgery includes tubal ligation.  No other complaints or concerns today.      Past Medical History:  Diagnosis Date   Herpes genitalis in women     Patient Active Problem List   Diagnosis Date Noted   Herpes genitalis in women 08/12/2014    Past Surgical History:  Procedure Laterality Date   TUBAL LIGATION       OB History     Gravida  2   Para  2   Term  2   Preterm      AB      Living  2      SAB      IAB      Ectopic      Multiple      Live Births  2           Family History  Problem Relation Age of Onset   Hypertension Mother    Dementia Mother    Parkinson's disease Mother     Social History   Tobacco Use   Smoking status: Every Day    Packs/day: 1.00    Years: 20.00    Pack years: 20.00    Types: Cigarettes   Smokeless tobacco: Never  Substance Use Topics   Alcohol use: Yes    Alcohol/week: 5.0 standard drinks    Types: 3 Cans of beer, 2 Shots of liquor per week    Comment: 2 beers daily   Drug use: Yes    Types: Marijuana    Home Medications Prior to Admission medications   Medication Sig Start Date End Date Taking? Authorizing Provider  amoxicillin-clavulanate (AUGMENTIN) 875-125 MG tablet Take 1 tablet by mouth every 12 (twelve) hours. 08/11/21  Yes Jeannie Fend, PA-C  ondansetron  (ZOFRAN-ODT) 4 MG disintegrating tablet Take 1 tablet (4 mg total) by mouth every 8 (eight) hours as needed for nausea or vomiting. 08/11/21  Yes Jeannie Fend, PA-C  acyclovir (ZOVIRAX) 400 MG tablet TAKE 1 TABLET BY MOUTH TWICE DAILY FOR SUPPRESSION 05/28/18   Tilda Burrow, MD  dicyclomine (BENTYL) 20 MG tablet Take 1 tablet (20 mg total) by mouth 2 (two) times daily. 04/25/21   Tanda Rockers, PA-C  metoCLOPramide (REGLAN) 10 MG tablet Take 1 tablet (10 mg total) by mouth every 8 (eight) hours as needed for up to 6 doses for nausea or vomiting. 04/28/21   Gloris Manchester, MD  pantoprazole (PROTONIX) 40 MG tablet Take 1 tablet (40 mg total) by mouth daily. 04/25/21   Milagros Loll, MD  promethazine (PHENERGAN) 25 MG suppository Place 1 suppository (25 mg total) rectally every 6 (six) hours as needed for nausea or vomiting. 04/25/21   Hyman Hopes, Margaux, PA-C  sucralfate (CARAFATE) 1 GM/10ML suspension Take 10 mLs (1 g total) by mouth 4 (four) times daily -  with meals and at bedtime for 14 days. 04/25/21 05/09/21  Milagros Loll, MD    Allergies    Patient has no known allergies.  Review of Systems   Review of Systems  Constitutional:  Positive for chills and fever.  Respiratory:  Negative for shortness of breath.   Cardiovascular:  Negative for chest pain.  Gastrointestinal:  Positive for abdominal pain, diarrhea, nausea and vomiting. Negative for blood in stool and constipation.  Genitourinary:  Positive for decreased urine volume. Negative for dysuria.  Musculoskeletal:  Negative for arthralgias and myalgias.  Skin:  Negative for rash and wound.  Allergic/Immunologic: Negative for immunocompromised state.  Neurological:  Negative for weakness.  Hematological:  Does not bruise/bleed easily.  Psychiatric/Behavioral:  Negative for confusion.   All other systems reviewed and are negative.  Physical Exam Updated Vital Signs BP 116/75   Pulse (!) 114   Temp 98.1 F (36.7 C) (Oral)    Resp 18   Ht 5\' 6"  (1.676 m)   Wt 54.4 kg   LMP 05/24/2013   SpO2 98%   BMI 19.37 kg/m   Physical Exam Vitals and nursing note reviewed.  Constitutional:      General: She is not in acute distress.    Appearance: She is well-developed. She is not diaphoretic.     Comments: Patient appears uncomfortable with chills.  HENT:     Head: Normocephalic and atraumatic.  Eyes:     Conjunctiva/sclera: Conjunctivae normal.  Cardiovascular:     Rate and Rhythm: Normal rate and regular rhythm.     Heart sounds: Normal heart sounds.  Pulmonary:     Effort: Pulmonary effort is normal.     Breath sounds: Normal breath sounds.  Abdominal:     Palpations: Abdomen is soft.     Tenderness: There is generalized abdominal tenderness.  Skin:    General: Skin is warm and dry.     Findings: No erythema or rash.  Neurological:     Mental Status: She is alert and oriented to person, place, and time.  Psychiatric:        Behavior: Behavior normal.    ED Results / Procedures / Treatments   Labs (all labs ordered are listed, but only abnormal results are displayed) Labs Reviewed  COMPREHENSIVE METABOLIC PANEL - Abnormal; Notable for the following components:      Result Value   Potassium 3.2 (*)    Chloride 97 (*)    Glucose, Bld 132 (*)    Total Protein 9.3 (*)    Albumin 5.6 (*)    Anion gap 18 (*)    All other components within normal limits  CBC - Abnormal; Notable for the following components:   WBC 12.4 (*)    RBC 5.39 (*)    Hemoglobin 17.8 (*)    HCT 48.7 (*)    MCHC 36.6 (*)    All other components within normal limits  URINALYSIS, ROUTINE W REFLEX MICROSCOPIC - Abnormal; Notable for the following components:   Hgb urine dipstick MODERATE (*)    Protein, ur 100 (*)    All other components within normal limits  URINALYSIS, MICROSCOPIC (REFLEX) - Abnormal; Notable for the following components:   Bacteria, UA FEW (*)    All other components within normal limits  CULTURE, BLOOD  (ROUTINE X 2)  CULTURE, BLOOD (ROUTINE X 2)  LIPASE, BLOOD  PREGNANCY, URINE    EKG None  Radiology No results found.  Procedures Procedures   Medications  Ordered in ED Medications  ondansetron (ZOFRAN) injection 4 mg (4 mg Intravenous Given 08/11/21 1533)  acetaminophen (TYLENOL) tablet 650 mg (650 mg Oral Given 08/11/21 1605)  ondansetron (ZOFRAN) injection 4 mg (4 mg Intravenous Given 08/11/21 1532)  sodium chloride 0.9 % bolus 1,000 mL (0 mLs Intravenous Stopped 08/11/21 1755)  sodium chloride 0.9 % bolus 1,000 mL (0 mLs Intravenous Stopped 08/11/21 1654)  morphine 4 MG/ML injection 4 mg (4 mg Intravenous Given 08/11/21 1726)  potassium chloride SA (KLOR-CON M) CR tablet 40 mEq (40 mEq Oral Given 08/11/21 1727)    ED Course  I have reviewed the triage vital signs and the nursing notes.  Pertinent labs & imaging results that were available during my care of the patient were reviewed by me and considered in my medical decision making (see chart for details).  Clinical Course as of 08/11/21 1856  Sat Aug 11, 2021  [id number] 50 year old female with complaint nausea, vomiting, diarrhea with crampy abdominal pain, sudden onset at 5 AM this morning.  On exam patient with rigors, appears to be uncomfortable, generalized diffuse abdominal pain. Patient was given IV fluids, Zofran and morphine for her pain. Labs reviewed, found to have elevated white blood cell count 12.4.  Lipase within normal notes.  Urinalysis with protein, few bacteria, no urinary symptoms.  CMP with mild hypokalemia with potassium of 3.2.  Creatinine normal.  LFTs normal. On recheck, now with focal left lower quadrant pain. Offered CT scan however patient declines secondary to cost. Patient has completed her fluid bolus, feels better at this time, remains afebrile. Continues to decline CT scan, agreeable with plan for blood cultures x2, will start Augmentin due to history of diverticulosis on her August 2022 CT  scan. Given strong return to ER precautions, should she have persisting pain, return of fever, any worsening or concerning symptoms, she should present to the emergency room or seek care, may need CT scan if not improving.  Recommend recheck with her PCP on Monday.  Patient verbalizes understanding of discharge instructions and plan. [LM]    Clinical Course User Index [LM] Alden Hipp   MDM Rules/Calculators/A&P                           Final Clinical Impression(s) / ED Diagnoses Final diagnoses:  Nausea vomiting and diarrhea  Generalized abdominal pain  Hypokalemia    Rx / DC Orders ED Discharge Orders          Ordered    ondansetron (ZOFRAN-ODT) 4 MG disintegrating tablet  Every 8 hours PRN        08/11/21 1846    amoxicillin-clavulanate (AUGMENTIN) 875-125 MG tablet  Every 12 hours        08/11/21 1846             Jeannie Fend, PA-C 08/11/21 1856    Maia Plan, MD 08/11/21 1950

## 2021-08-17 LAB — CULTURE, BLOOD (ROUTINE X 2)
Culture: NO GROWTH
Culture: NO GROWTH
Special Requests: ADEQUATE
Special Requests: ADEQUATE

## 2023-01-20 IMAGING — CT CT ABD-PELV W/ CM
2 of 3 series · 16 of 46 positions shown, 18 images · IV contrast (Omnipaque)
Comparison: 05/16/2004

CLINICAL DATA: Abdominal pain and fever, initial encounter

EXAM:
CT ABDOMEN AND PELVIS WITH CONTRAST
TECHNIQUE: Multidetector CT imaging of the abdomen and pelvis was performed
using the standard protocol following bolus administration of
intravenous contrast.
CONTRAST:  75mL OMNIPAQUE IOHEXOL 300 MG/ML  SOLN

[Series 2: axial st · axial · 0.70mm/px · z∈[-754,-399]mm · 13 of 83 slices shown, 15 images]
[im 6/83  soft-tissue]
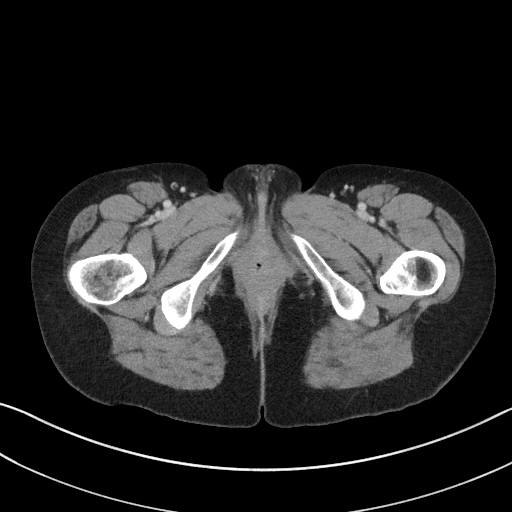
[im 6/83  bone]
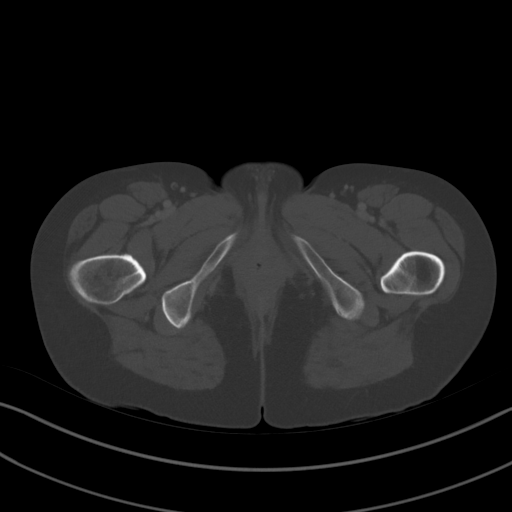
[im 11/83  soft-tissue]
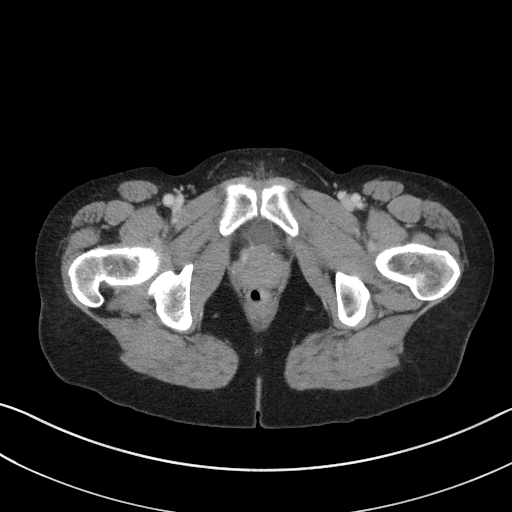
[im 16/83  soft-tissue]
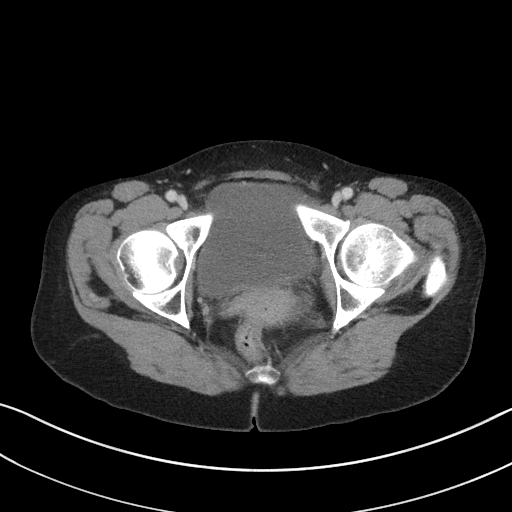
[im 24/83  soft-tissue]
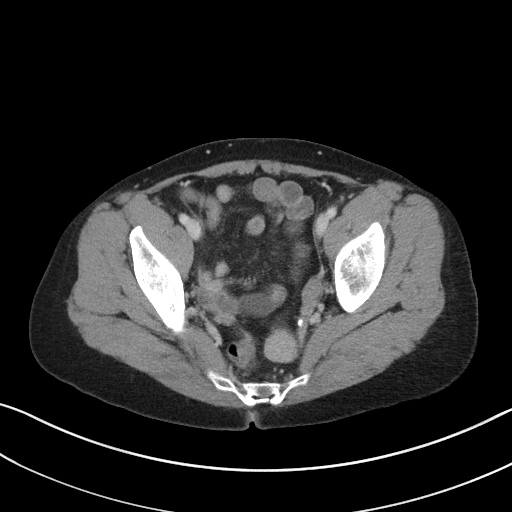
[im 30/83  soft-tissue]
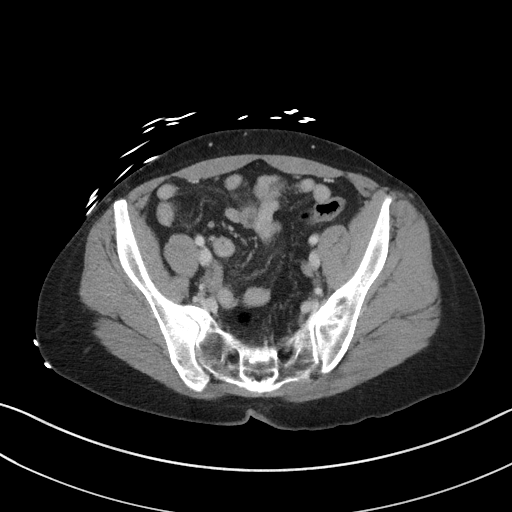
[im 35/83  soft-tissue]
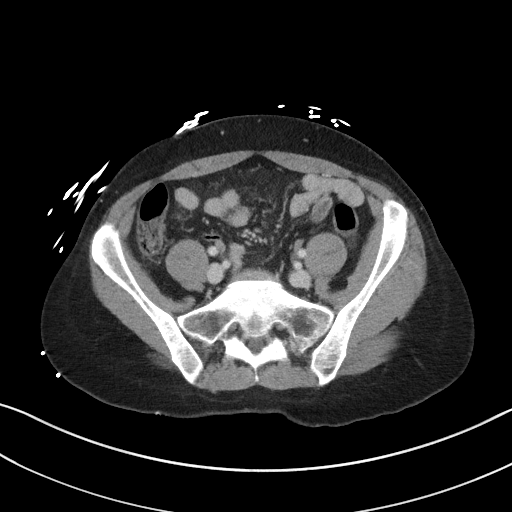
[im 43/83  soft-tissue]
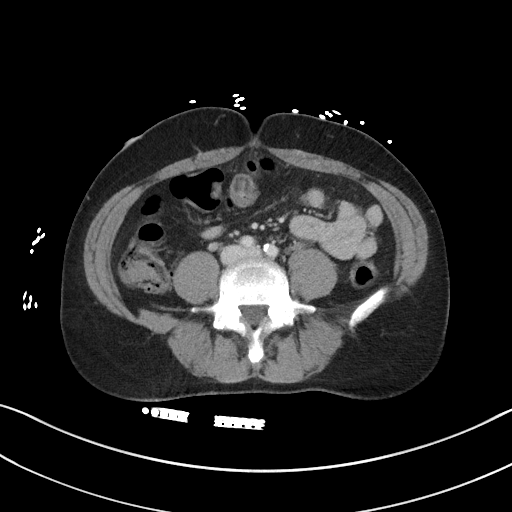
[im 48/83  soft-tissue]
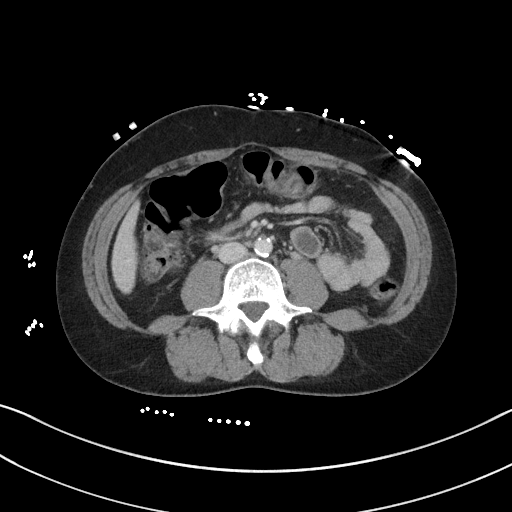
[im 53/83  soft-tissue]
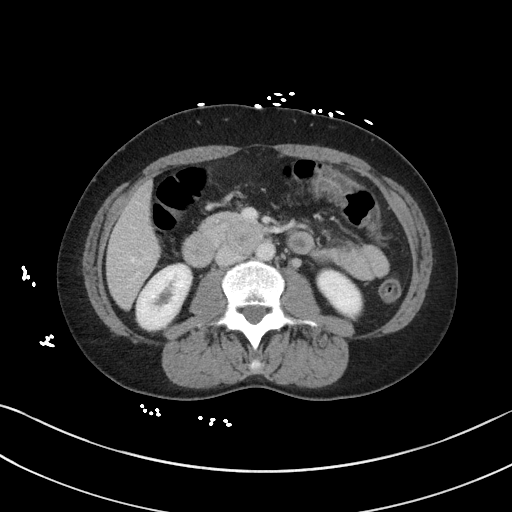
[im 53/83  bone]
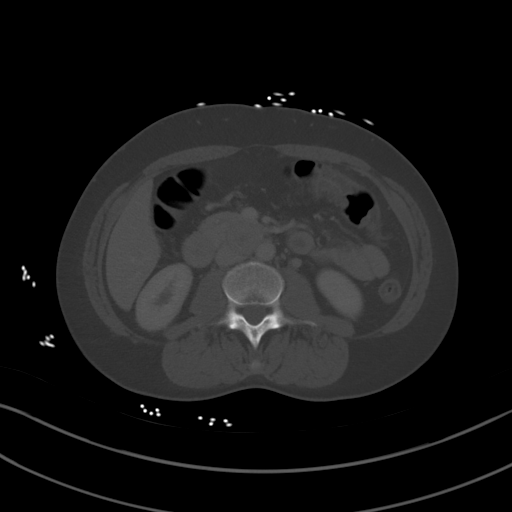
[im 59/83  soft-tissue]
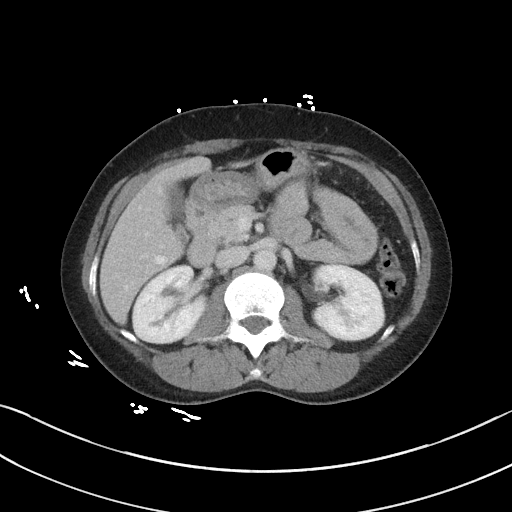
[im 67/83  soft-tissue]
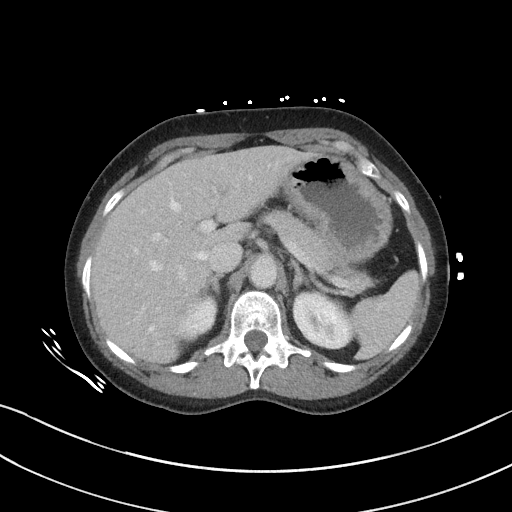
[im 72/83  soft-tissue]
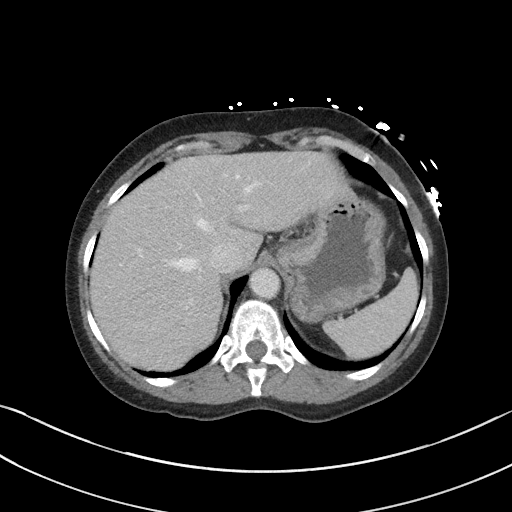
[im 77/83  soft-tissue]
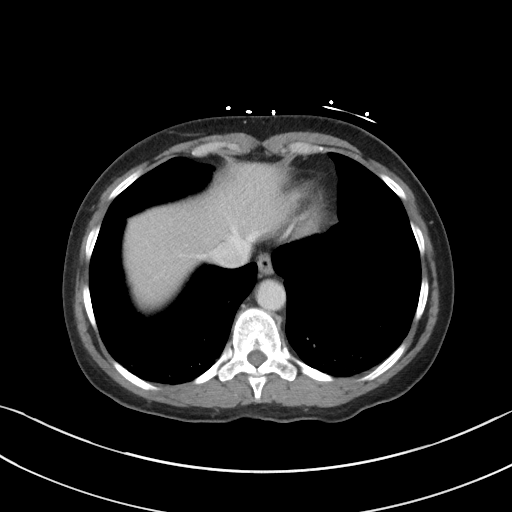

[Series 5: coronal st · coronal · 0.61mm/px · 3 of 97 slices shown]
[im 33/97  soft-tissue]
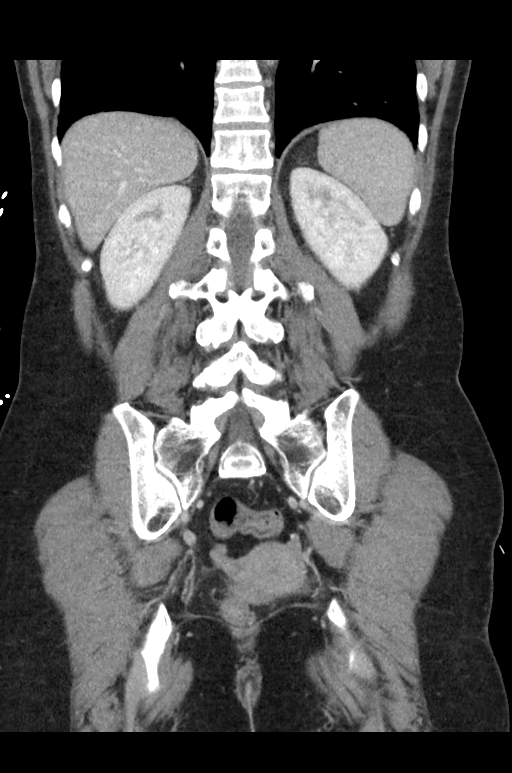
[im 43/97  soft-tissue]
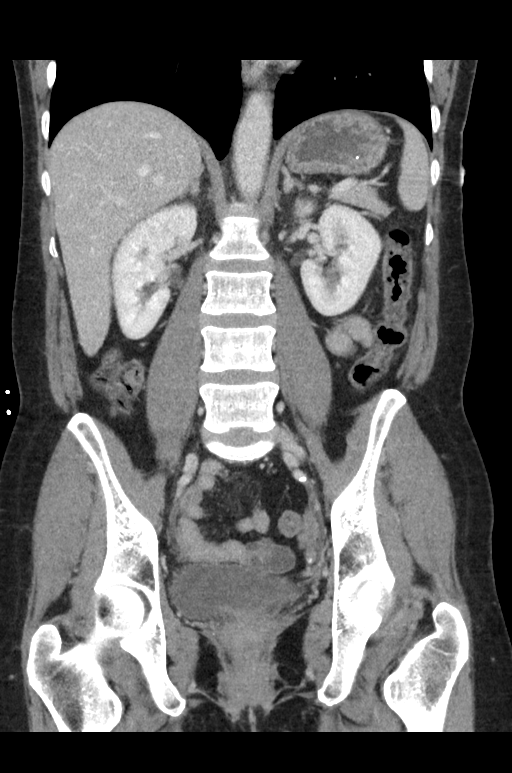
[im 54/97  soft-tissue]
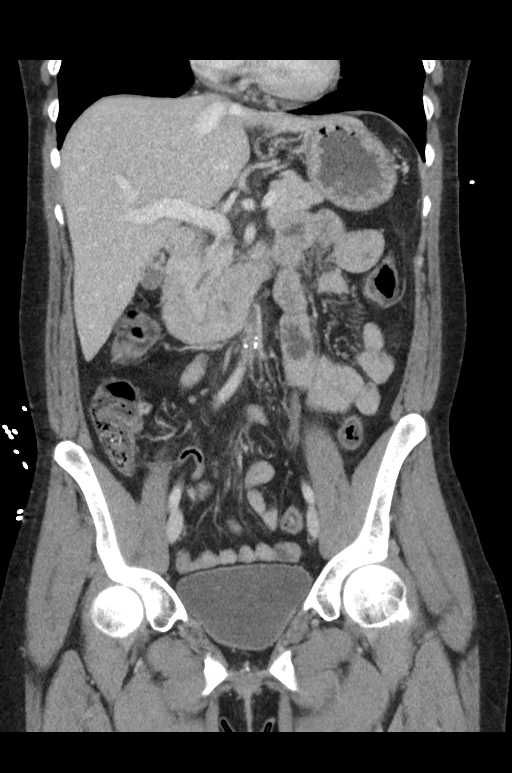

[16 of 46 positions shown; findings below may reference images not displayed]

FINDINGS: Lower chest: Mild dependent atelectatic changes are noted. No focal
infiltrate or effusion is seen.

Hepatobiliary: No focal liver abnormality is seen. No gallstones,
gallbladder wall thickening, or biliary dilatation.

Pancreas: Unremarkable. No pancreatic ductal dilatation or
surrounding inflammatory changes.

Spleen: Normal in size without focal abnormality.

Adrenals/Urinary Tract: Adrenal glands are within normal limits.
Kidneys demonstrate a normal enhancement pattern bilaterally. No
renal calculi or obstructive changes are seen. Delayed images
demonstrate normal excretion of contrast material. Bladder is well
distended.

Stomach/Bowel: Minimal diverticular change of the colon is noted
without evidence of diverticulitis. The appendix is within normal
limits. Small bowel and stomach are within normal limits.

Vascular/Lymphatic: Aortic atherosclerosis. No enlarged abdominal or
pelvic lymph nodes.

Reproductive: Uterus and bilateral adnexa are unremarkable.

Other: No abdominal wall hernia or abnormality. No abdominopelvic
ascites.

Musculoskeletal: No acute or significant osseous findings.
IMPRESSION: Mild diverticulosis without diverticulitis.

No other focal abnormality is noted.
